# Patient Record
Sex: Female | Born: 1990
Health system: Southern US, Community
[De-identification: ages and names within clinical notes are randomized; demographics above are authoritative.]

---
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
## 2012-01-29 NOTE — L&D Delivery Note (Signed)
Attestation of Attending Supervision of Advanced Practitioner (CNM/NP): Evaluation and management procedures were performed by the Advanced Practitioner under my supervision and collaboration.  I have reviewed the Advanced Practitioner's note and chart, and I agree with the management and plan.  Skylor Hughson 02/26/2012 9:29 AM   

## 2012-01-29 NOTE — L&D Delivery Note (Signed)
Delivery Note At 7:28 AM a viable female was delivered via Vaginal, Spontaneous Delivery (Presentation: Left Occiput Anterior).  APGAR: 8, 9; weight:pending.   Placenta status: Intact, Spontaneous.  Cord: 3 vessels with the following complications: .  Cord pH: not done  Anesthesia: Local  Episiotomy: None Lacerations: 1st degree;Perineal Suture Repair: 3.0 monocryl Est. Blood Loss (mL): 350  Mom to postpartum.  Baby to nursery-stable. Breast and bottlefeeding, condoms for contraception Marge Duncans 02/23/2012, 7:49 AM

## 2012-02-06 ENCOUNTER — Other Ambulatory Visit (HOSPITAL_COMMUNITY): Payer: Self-pay | Admitting: Nurse Practitioner

## 2012-02-06 DIAGNOSIS — Z1389 Encounter for screening for other disorder: Secondary | ICD-10-CM

## 2012-02-07 ENCOUNTER — Ambulatory Visit (HOSPITAL_COMMUNITY)
Admission: RE | Admit: 2012-02-07 | Discharge: 2012-02-07 | Disposition: A | Discharge status: Home / Self Care | Payer: Medicaid Other | Source: Ambulatory Visit | Attending: Nurse Practitioner | Admitting: Nurse Practitioner

## 2012-02-07 DIAGNOSIS — O365990 Maternal care for other known or suspected poor fetal growth, unspecified trimester, not applicable or unspecified: Secondary | ICD-10-CM | POA: Insufficient documentation

## 2012-02-07 DIAGNOSIS — Z1389 Encounter for screening for other disorder: Secondary | ICD-10-CM

## 2012-02-07 DIAGNOSIS — Z3689 Encounter for other specified antenatal screening: Secondary | ICD-10-CM | POA: Insufficient documentation

## 2012-02-13 ENCOUNTER — Other Ambulatory Visit (HOSPITAL_COMMUNITY): Payer: Self-pay | Admitting: Family

## 2012-02-13 DIAGNOSIS — O26849 Uterine size-date discrepancy, unspecified trimester: Secondary | ICD-10-CM

## 2012-02-15 LAB — OB RESULTS CONSOLE GBS: GBS: NEGATIVE

## 2012-02-23 ENCOUNTER — Inpatient Hospital Stay (HOSPITAL_COMMUNITY)
Admission: AD | Admit: 2012-02-23 | Discharge: 2012-02-24 | DRG: 775 | Disposition: A | Discharge status: Home / Self Care | Payer: Medicaid Other | Source: Ambulatory Visit | Attending: Obstetrics and Gynecology | Admitting: Obstetrics and Gynecology

## 2012-02-23 ENCOUNTER — Encounter (HOSPITAL_COMMUNITY): Payer: Self-pay | Admitting: *Deleted

## 2012-02-23 LAB — CBC
HCT: 36.8 % (ref 36.0–46.0)
Hemoglobin: 12.4 g/dL (ref 12.0–15.0)
MCV: 83.6 fL (ref 78.0–100.0)
WBC: 8 10*3/uL (ref 4.0–10.5)

## 2012-02-23 LAB — TYPE AND SCREEN: Antibody Screen: NEGATIVE

## 2012-02-23 MED ORDER — MEASLES, MUMPS & RUBELLA VAC ~~LOC~~ INJ
0.5000 mL | INJECTION | Freq: Once | SUBCUTANEOUS | Status: DC
  Filled 2012-02-23: qty 0.5

## 2012-02-23 MED ORDER — ONDANSETRON HCL 4 MG/2ML IJ SOLN: 4.0000 mg | INTRAMUSCULAR | Status: DC | PRN

## 2012-02-23 MED ORDER — OXYTOCIN 40 UNITS IN LACTATED RINGERS INFUSION - SIMPLE MED: 62.5000 mL/h | INTRAVENOUS | Status: DC | PRN

## 2012-02-23 MED ORDER — BENZOCAINE-MENTHOL 20-0.5 % EX AERO
1.0000 "application " | INHALATION_SPRAY | CUTANEOUS | Status: DC | PRN
  Administered 2012-02-23: 1 via TOPICAL
  Filled 2012-02-23: qty 56

## 2012-02-23 MED ORDER — SODIUM CHLORIDE 0.9 % IV SOLN: 250.0000 mL | INTRAVENOUS | Status: DC | PRN

## 2012-02-23 MED ORDER — WITCH HAZEL-GLYCERIN EX PADS: 1.0000 "application " | MEDICATED_PAD | CUTANEOUS | Status: DC | PRN

## 2012-02-23 MED ORDER — LACTATED RINGERS IV SOLN: INTRAVENOUS | Status: DC

## 2012-02-23 MED ORDER — ZOLPIDEM TARTRATE 5 MG PO TABS: 5.0000 mg | ORAL_TABLET | Freq: Every evening | ORAL | Status: DC | PRN

## 2012-02-23 MED ORDER — OXYTOCIN BOLUS FROM INFUSION: 500.0000 mL | INTRAVENOUS | Status: DC

## 2012-02-23 MED ORDER — ONDANSETRON HCL 4 MG PO TABS: 4.0000 mg | ORAL_TABLET | ORAL | Status: DC | PRN

## 2012-02-23 MED ORDER — IBUPROFEN 600 MG PO TABS
600.0000 mg | ORAL_TABLET | Freq: Four times a day (QID) | ORAL | Status: DC
  Administered 2012-02-23 – 2012-02-24 (×5): 600 mg via ORAL
  Filled 2012-02-23 (×5): qty 1

## 2012-02-23 MED ORDER — BISACODYL 10 MG RE SUPP: 10.0000 mg | Freq: Every day | RECTAL | Status: DC | PRN

## 2012-02-23 MED ORDER — HYDROXYZINE HCL 50 MG/ML IM SOLN: 50.0000 mg | Freq: Four times a day (QID) | INTRAMUSCULAR | Status: DC | PRN

## 2012-02-23 MED ORDER — SIMETHICONE 80 MG PO CHEW: 80.0000 mg | CHEWABLE_TABLET | ORAL | Status: DC | PRN

## 2012-02-23 MED ORDER — FLEET ENEMA 7-19 GM/118ML RE ENEM: 1.0000 | ENEMA | Freq: Every day | RECTAL | Status: DC | PRN

## 2012-02-23 MED ORDER — SODIUM CHLORIDE 0.9 % IJ SOLN: 3.0000 mL | INTRAMUSCULAR | Status: DC | PRN

## 2012-02-23 MED ORDER — CITRIC ACID-SODIUM CITRATE 334-500 MG/5ML PO SOLN: 30.0000 mL | ORAL | Status: DC | PRN

## 2012-02-23 MED ORDER — OXYCODONE-ACETAMINOPHEN 5-325 MG PO TABS: 1.0000 | ORAL_TABLET | ORAL | Status: DC | PRN

## 2012-02-23 MED ORDER — DIBUCAINE 1 % RE OINT: 1.0000 "application " | TOPICAL_OINTMENT | RECTAL | Status: DC | PRN

## 2012-02-23 MED ORDER — ACETAMINOPHEN 325 MG PO TABS: 650.0000 mg | ORAL_TABLET | ORAL | Status: DC | PRN

## 2012-02-23 MED ORDER — DIPHENHYDRAMINE HCL 25 MG PO CAPS: 25.0000 mg | ORAL_CAPSULE | Freq: Four times a day (QID) | ORAL | Status: DC | PRN

## 2012-02-23 MED ORDER — FENTANYL CITRATE 0.05 MG/ML IJ SOLN: 100.0000 ug | INTRAMUSCULAR | Status: DC | PRN

## 2012-02-23 MED ORDER — LIDOCAINE HCL (PF) 1 % IJ SOLN
30.0000 mL | INTRAMUSCULAR | Status: DC | PRN
  Administered 2012-02-23: 30 mL via SUBCUTANEOUS
  Filled 2012-02-23: qty 30

## 2012-02-23 MED ORDER — OXYCODONE-ACETAMINOPHEN 5-325 MG PO TABS
1.0000 | ORAL_TABLET | ORAL | Status: DC | PRN
  Administered 2012-02-23: 1 via ORAL
  Filled 2012-02-23: qty 1

## 2012-02-23 MED ORDER — TETANUS-DIPHTH-ACELL PERTUSSIS 5-2.5-18.5 LF-MCG/0.5 IM SUSP
0.5000 mL | Freq: Once | INTRAMUSCULAR | Status: AC
  Administered 2012-02-24: 0.5 mL via INTRAMUSCULAR
  Filled 2012-02-23: qty 0.5

## 2012-02-23 MED ORDER — OXYTOCIN 40 UNITS IN LACTATED RINGERS INFUSION - SIMPLE MED
62.5000 mL/h | INTRAVENOUS | Status: DC
  Administered 2012-02-23: 62.5 mL/h via INTRAVENOUS
  Filled 2012-02-23: qty 1000

## 2012-02-23 MED ORDER — SENNOSIDES-DOCUSATE SODIUM 8.6-50 MG PO TABS
2.0000 | ORAL_TABLET | Freq: Every day | ORAL | Status: DC
  Administered 2012-02-23: 2 via ORAL

## 2012-02-23 MED ORDER — IBUPROFEN 600 MG PO TABS
600.0000 mg | ORAL_TABLET | Freq: Four times a day (QID) | ORAL | Status: DC | PRN
  Administered 2012-02-23: 600 mg via ORAL
  Filled 2012-02-23: qty 1

## 2012-02-23 MED ORDER — ONDANSETRON HCL 4 MG/2ML IJ SOLN: 4.0000 mg | Freq: Four times a day (QID) | INTRAMUSCULAR | Status: DC | PRN

## 2012-02-23 MED ORDER — HYDROXYZINE HCL 50 MG PO TABS: 50.0000 mg | ORAL_TABLET | Freq: Four times a day (QID) | ORAL | Status: DC | PRN

## 2012-02-23 MED ORDER — SODIUM CHLORIDE 0.9 % IJ SOLN: 3.0000 mL | Freq: Two times a day (BID) | INTRAMUSCULAR | Status: DC

## 2012-02-23 MED ORDER — LACTATED RINGERS IV SOLN: 500.0000 mL | INTRAVENOUS | Status: DC | PRN

## 2012-02-23 MED ORDER — LANOLIN HYDROUS EX OINT: TOPICAL_OINTMENT | CUTANEOUS | Status: DC | PRN

## 2012-02-23 MED ORDER — PRENATAL MULTIVITAMIN CH
1.0000 | ORAL_TABLET | Freq: Every day | ORAL | Status: DC
  Administered 2012-02-23 – 2012-02-24 (×2): 1 via ORAL
  Filled 2012-02-23 (×2): qty 1

## 2012-02-23 MED ORDER — FLEET ENEMA 7-19 GM/118ML RE ENEM: 1.0000 | ENEMA | RECTAL | Status: DC | PRN

## 2012-02-23 NOTE — MAU Note (Signed)
PT ARRIVED   VERY WET AND UNCOMFORTABLE ,  CALLED TO  THRU-PUT .   WITH W/C  TO RM 165

## 2012-02-23 NOTE — H&P (Signed)
Latasha Kramer is a 22 y.o. G11P1001 female at [redacted]w[redacted]d by LMP that correlates well w/ 19wk u/s, presenting w/ report of large gush of fluid at 0430 w/ uc's.  Good fm.  Denies vb.  Onset of regular pnc at [facility] @ 18.1wks.  Genetic screening and anatomy u/s normal. 1hr glucola 93, gbs neg.  H/O SVD @ 40wks, 6lb6oz infant.   Maternal Medical History:  Reason for admission: Reason for admission: rupture of membranes and contractions.  Contractions: Onset was 1-2 hours ago.   Frequency: regular.   Perceived severity is strong.    Fetal activity: Perceived fetal activity is normal.   Last perceived fetal movement was within the past hour.    Prenatal complications: Late care @ 18.1wks  Prenatal Complications - Diabetes: none.    OB History    Grav Para Term Preterm Abortions TAB SAB Ect Mult Living   2 1 1  0 0 0 0 0 0 1     History reviewed. No pertinent past medical history. History reviewed. No pertinent past surgical history. Family History: family history is not on file. Social History:  reports that she has never smoked. She has never used smokeless tobacco. She reports that she does not drink alcohol or use illicit drugs.   Review of Systems  Constitutional: Negative.   HENT: Negative.   Eyes: Negative.   Respiratory: Negative.   Cardiovascular: Negative.   Gastrointestinal: Positive for abdominal pain (uc's).  Genitourinary: Negative.   Musculoskeletal: Negative.   Skin: Negative.   Neurological: Negative.   Endo/Heme/Allergies: Negative.   Psychiatric/Behavioral: Negative.    Blood pressure 115/76, pulse 85, temperature 98 F (36.7 C), temperature source Oral, resp. rate 20, height 5\' 1"  (1.549 m), weight 60.328 kg (133 lb).  Maternal Exam:  Uterine Assessment: Contraction strength is firm.  Contraction frequency is regular.   Abdomen: Fetal presentation: vertex  Introitus: Normal vulva. Normal vagina.  Ferning test: not done.  Amniotic fluid character: clear.  Pelvis:  adequate for delivery.   Cervix: Cervix evaluated by digital exam.     Physical Exam  Constitutional: She is oriented to person, place, and time. She appears well-developed and well-nourished.  HENT:  Head: Normocephalic.  Neck: Normal range of motion.  Cardiovascular: Normal rate and regular rhythm.   Respiratory: Effort normal and breath sounds normal.  GI: Soft. There is no tenderness.       gravid  Genitourinary: Vagina normal and uterus normal.  Musculoskeletal: Normal range of motion.  Neurological: She is alert and oriented to person, place, and time. She has normal reflexes.  Skin: Skin is warm and dry.  Psychiatric: She has a normal mood and affect. Her behavior is normal. Judgment and thought content normal.    FHR: 145, min variability, no accels, earlies= Cat II UCs: 1-8mins SVE by RN: 4/70/-1 to 0, grossly ruptured clear fluid  Prenatal labs: ABO, Rh: --/--/O POS (01/26 1610) Antibody: NEG (01/26 0640) Rubella: Immune (09/11 0000) RPR: Nonreactive (11/22 0000)  HBsAg: Negative (09/11 0000)  HIV: Non-reactive (09/11 0000)  GBS: Negative (01/18 0000)  1hr=93  Assessment/Plan: A:  [redacted]w[redacted]d SIUP  SROM w/ active labor  GBS neg  Cat II FHR  P:  Admit to BS  Expectant management  IV pain meds/epidural prn request  Anticipate NSVD   Marge Duncans 02/23/2012, 0700

## 2012-02-24 LAB — HEMOGLOBIN AND HEMATOCRIT, BLOOD: Hemoglobin: 11.7 g/dL — ABNORMAL LOW (ref 12.0–15.0)

## 2012-02-24 LAB — RPR: RPR Ser Ql: NONREACTIVE

## 2012-02-24 MED ORDER — IBUPROFEN 600 MG PO TABS: 600.0000 mg | ORAL_TABLET | Freq: Four times a day (QID) | ORAL | Status: DC

## 2012-02-24 NOTE — Progress Notes (Signed)
UR chart review completed.  

## 2012-02-24 NOTE — Discharge Summary (Signed)
Obstetric Discharge Summary Reason for Admission: onset of labor and rupture of membranes Prenatal Procedures: ultrasound Intrapartum Procedures: spontaneous vaginal delivery Postpartum Procedures: none Complications-Operative and Postpartum: 1st  degree perineal laceration Hemoglobin  Date Value Range Status  02/23/2012 12.4  12.0 - 15.0 g/dL Final     HCT  Date Value Range Status  02/23/2012 36.8  36.0 - 46.0 % Final    Physical Exam:  General: alert, cooperative and no distress Lochia: appropriate Uterine Fundus: firm Incision: no hematoma at site of repair DVT Evaluation: No evidence of DVT seen on physical exam. No cords or calf tenderness. No significant calf/ankle edema.  Discharge Diagnoses: Term Pregnancy-delivered  Discharge Information: Date: 02/24/2012 Activity: pelvic rest Diet: routine Medications: PNV and Ibuprofen Condition: stable Instructions: refer to practice specific booklet Discharge to: home Follow-up Information    Follow up with [facility]. Schedule an appointment as soon as possible for a visit in 6 weeks.   Contact information:   [address] [place] [address] [phone]         Newborn Data: Live born female  Birth Weight: 5 lb 14.9 oz (2690 g) APGAR: 8, 9  Home with mother.  Bonnita Nasuti 02/24/2012, 7:40 AM I have seen the patient with the resident and agree with the above

## 2012-02-24 NOTE — H&P (Signed)
Attestation of Attending Supervision of Advanced Practitioner (CNM/NP): Evaluation and management procedures were performed by the Advanced Practitioner under my supervision and collaboration.  I have reviewed the Advanced Practitioner's note and chart, and I agree with the management and plan.  Khiry Pasquariello 02/24/2012 12:13 PM

## 2012-02-25 NOTE — Discharge Summary (Signed)
I have reviewed chart and agree with the plan of care.  HARRAWAY-SMITH, Herminio Kniskern, M.D, FACOG    

## 2012-02-27 ENCOUNTER — Ambulatory Visit (HOSPITAL_COMMUNITY): Admission: RE | Admit: 2012-02-27 | Payer: Medicaid Other | Source: Ambulatory Visit

## 2013-02-13 ENCOUNTER — Emergency Department (HOSPITAL_COMMUNITY)
Admission: EM | Admit: 2013-02-13 | Discharge: 2013-02-13 | Disposition: A | Discharge status: Home / Self Care | Payer: Medicaid Other | Attending: Emergency Medicine | Admitting: Emergency Medicine

## 2013-02-13 ENCOUNTER — Encounter (HOSPITAL_COMMUNITY): Payer: Self-pay | Admitting: Emergency Medicine

## 2013-02-13 DIAGNOSIS — N390 Urinary tract infection, site not specified: Secondary | ICD-10-CM

## 2013-02-13 DIAGNOSIS — T8384XA Pain from genitourinary prosthetic devices, implants and grafts, initial encounter: Secondary | ICD-10-CM

## 2013-02-13 DIAGNOSIS — Z3202 Encounter for pregnancy test, result negative: Secondary | ICD-10-CM | POA: Insufficient documentation

## 2013-02-13 DIAGNOSIS — G8918 Other acute postprocedural pain: Secondary | ICD-10-CM | POA: Insufficient documentation

## 2013-02-13 LAB — WET PREP, GENITAL
CLUE CELLS WET PREP: NONE SEEN
TRICH WET PREP: NONE SEEN
YEAST WET PREP: NONE SEEN

## 2013-02-13 LAB — URINE MICROSCOPIC-ADD ON

## 2013-02-13 LAB — URINALYSIS, ROUTINE W REFLEX MICROSCOPIC
Bilirubin Urine: NEGATIVE
GLUCOSE, UA: NEGATIVE mg/dL
KETONES UR: NEGATIVE mg/dL
NITRITE: NEGATIVE
PH: 7 (ref 5.0–8.0)
Protein, ur: NEGATIVE mg/dL
SPECIFIC GRAVITY, URINE: 1.015 (ref 1.005–1.030)
Urobilinogen, UA: 0.2 mg/dL (ref 0.0–1.0)

## 2013-02-13 LAB — PREGNANCY, URINE: PREG TEST UR: NEGATIVE

## 2013-02-13 MED ORDER — OXYCODONE-ACETAMINOPHEN 5-325 MG PO TABS
1.0000 | ORAL_TABLET | Freq: Once | ORAL | Status: AC
  Administered 2013-02-13: 1 via ORAL
  Filled 2013-02-13: qty 1

## 2013-02-13 MED ORDER — HYDROCODONE-ACETAMINOPHEN 5-325 MG PO TABS: 1.0000 | ORAL_TABLET | ORAL | Status: DC | PRN

## 2013-02-13 MED ORDER — CIPROFLOXACIN HCL 500 MG PO TABS: 500.0000 mg | ORAL_TABLET | Freq: Two times a day (BID) | ORAL | Status: DC

## 2013-02-13 MED ORDER — HYDROCODONE-ACETAMINOPHEN 5-325 MG PO TABS
1.0000 | ORAL_TABLET | Freq: Once | ORAL | Status: AC
  Administered 2013-02-13: 1 via ORAL
  Filled 2013-02-13: qty 1

## 2013-02-13 NOTE — Discharge Instructions (Signed)
Call for a follow up appointment with a Family or Primary Care Provider, call Women's clinic for evaluation of your IUD. Return if Symptoms worsen.   Take medication as prescribed.

## 2013-02-13 NOTE — ED Notes (Addendum)
Pt c/o pelvic pain and low back pain onset Tuesday. Pt denies injury. Pt denies pain with urination or discharge. Pt has IUD

## 2013-02-13 NOTE — ED Provider Notes (Signed)
CSN: [id number]     Arrival date & time 02/13/13  1610 History   First MD Initiated Contact with Patient 02/13/13 1727     Chief Complaint  Patient presents with  . Pelvic Pain   (Consider location/radiation/quality/duration/timing/severity/associated sxs/prior Treatment) HPI Comments: Latasha Kramer is a [age]year-old female, presenting the Emergency Department with a chief complaint of pelvic pain for 4 days.  The patient reports she had an IUD placed by the health department for 4 days ago and has had pelvic cramping since.  She reports associated lower back pain.  She reports hematuria today.  She reports las BM today, normal non-bloody. LMP 05/2011   The history is provided by the patient and medical records. No language interpreter was used.    History reviewed. No pertinent past medical history. History reviewed. No pertinent past surgical history. No family history on file. History  Substance Use Topics  . Smoking status: Never Smoker   . Smokeless tobacco: Never Used  . Alcohol Use: No   OB History   Grav Para Term Preterm Abortions TAB SAB Ect Mult Living   2 2 2  0 0 0 0 0 0 2     Review of Systems  Constitutional: Negative for fever and chills.  Gastrointestinal: Positive for abdominal pain. Negative for nausea, vomiting, diarrhea, constipation and blood in stool.  Genitourinary: Positive for hematuria and pelvic pain. Negative for dysuria, urgency, flank pain, vaginal bleeding and vaginal discharge.    Allergies  Review of patient's allergies indicates no known allergies.  Home Medications   Current Outpatient Rx  Name  Route  Sig  Dispense  Refill  . ibuprofen (ADVIL,MOTRIN) 600 MG tablet   Oral   Take 1 tablet (600 mg total) by mouth every 6 (six) hours.   50 tablet   3   . ciprofloxacin (CIPRO) 500 MG tablet   Oral   Take 1 tablet (500 mg total) by mouth 2 (two) times daily.   14 tablet   0   . HYDROcodone-acetaminophen (NORCO/VICODIN) 5-325 MG per tablet  Oral   Take 1 tablet by mouth every 4 (four) hours as needed.   15 tablet   0    BP 119/77  Pulse 83  Temp(Src) 97.9 F (36.6 C) (Oral)  Resp 20  Ht 5\' 1"  (1.549 m)  Wt 108 lb (48.988 kg)  BMI 20.42 kg/m2  SpO2 100%  LMP 06/13/2012  Breastfeeding? No Physical Exam  Nursing note and vitals reviewed. Constitutional: Vital signs are normal. She appears well-developed and well-nourished.  Appears uncomfortable  HENT:  Head: Normocephalic and atraumatic.  Eyes: No scleral icterus.  Cardiovascular: Normal rate and regular rhythm.   No murmur heard. Pulmonary/Chest: Effort normal and breath sounds normal.  Abdominal: Soft. Normal appearance and bowel sounds are normal. There is tenderness in the suprapubic area. There is no rebound, no guarding, no CVA tenderness, no tenderness at McBurney's point and negative Murphy's sign.    Genitourinary: Uterus is tender. Cervix exhibits discharge. Right adnexum displays no mass and no tenderness. Left adnexum displays no mass and no tenderness.  Tenderness with bimanual to palpation, scant bleeding with clear discharge per cervical os.  Two strings identified originating from the cervical os. Scant blood with discharge identified in the vaginal vault.  Musculoskeletal: Normal range of motion.  Neurological: She is alert.  Skin: Skin is warm and dry.  Psychiatric: She has a normal mood and affect. Her behavior is normal.    ED Course  Procedures (including critical care time) Labs Review Labs Reviewed  WET PREP, GENITAL - Abnormal; Notable for the following:    WBC, Wet Prep HPF POC FEW (*)    All other components within normal limits  URINALYSIS, ROUTINE W REFLEX MICROSCOPIC - Abnormal; Notable for the following:    APPearance CLOUDY (*)    Hgb urine dipstick MODERATE (*)    Leukocytes, UA MODERATE (*)    All other components within normal limits  URINE MICROSCOPIC-ADD ON - Abnormal; Notable for the following:    Bacteria, UA FEW (*)     All other components within normal limits  GC/CHLAMYDIA PROBE AMP  URINE CULTURE  PREGNANCY, URINE   Imaging Review No results found.  EKG Interpretation   None       MDM   1. Pain due to intrauterine contraceptive device (IUD)   2. UTI (urinary tract infection)    Pt with recent IUD placement. Pelvic exam reveals 2 strings and scant blood.  UA with positive leukocytes and Hbg, few bacteria.  Will treat for UTI and pain management.  Will have the patient follow up with the health department, where it was placed.  Referral to women's clinic. Discussed lab results, and treatment plan with the patient. Return precautions given. Reports understanding and no other concerns at this time.  Patient is stable for discharge at this time.   Meds given in ED:  Medications  oxyCODONE-acetaminophen (PERCOCET/ROXICET) 5-325 MG per tablet 1 tablet (1 tablet Oral Given 02/13/13 1647)  HYDROcodone-acetaminophen (NORCO/VICODIN) 5-325 MG per tablet 1 tablet (1 tablet Oral Given 02/13/13 1927)    Discharge Medication List as of 02/13/2013  8:33 PM    START taking these medications   Details  ciprofloxacin (CIPRO) 500 MG tablet Take 1 tablet (500 mg total) by mouth 2 (two) times daily., Starting 02/13/2013, Until Discontinued, Print    HYDROcodone-acetaminophen (NORCO/VICODIN) 5-325 MG per tablet Take 1 tablet by mouth every 4 (four) hours as needed., Starting 02/13/2013, Until Discontinued, Print            Clabe Seal, PA-C 02/16/13 2316

## 2013-02-15 LAB — URINE CULTURE

## 2013-02-15 LAB — GC/CHLAMYDIA PROBE AMP
CT Probe RNA: NEGATIVE
GC PROBE AMP APTIMA: NEGATIVE

## 2013-02-22 NOTE — ED Provider Notes (Signed)
Medical screening examination/treatment/procedure(s) were performed by non-physician practitioner and as supervising physician I was immediately available for consultation/collaboration.  EKG Interpretation   None         Rolan BuccoMelanie Romey Mathieson, MD 02/22/13 0710

## 2013-03-16 ENCOUNTER — Encounter (HOSPITAL_COMMUNITY): Payer: Self-pay | Admitting: Emergency Medicine

## 2013-03-16 ENCOUNTER — Emergency Department (HOSPITAL_COMMUNITY): Payer: Medicaid Other

## 2013-03-16 ENCOUNTER — Emergency Department (HOSPITAL_COMMUNITY)
Admission: EM | Admit: 2013-03-16 | Discharge: 2013-03-16 | Disposition: A | Discharge status: Home / Self Care | Payer: Medicaid Other | Attending: Emergency Medicine | Admitting: Emergency Medicine

## 2013-03-16 DIAGNOSIS — R109 Unspecified abdominal pain: Secondary | ICD-10-CM | POA: Insufficient documentation

## 2013-03-16 DIAGNOSIS — Z3202 Encounter for pregnancy test, result negative: Secondary | ICD-10-CM | POA: Insufficient documentation

## 2013-03-16 DIAGNOSIS — Z792 Long term (current) use of antibiotics: Secondary | ICD-10-CM | POA: Insufficient documentation

## 2013-03-16 DIAGNOSIS — K5900 Constipation, unspecified: Secondary | ICD-10-CM | POA: Insufficient documentation

## 2013-03-16 LAB — CBC WITH DIFFERENTIAL/PLATELET
BASOS PCT: 0 % (ref 0–1)
Basophils Absolute: 0 10*3/uL (ref 0.0–0.1)
EOS ABS: 0 10*3/uL (ref 0.0–0.7)
Eosinophils Relative: 0 % (ref 0–5)
HEMATOCRIT: 40.1 % (ref 36.0–46.0)
HEMOGLOBIN: 14.2 g/dL (ref 12.0–15.0)
LYMPHS ABS: 1.2 10*3/uL (ref 0.7–4.0)
Lymphocytes Relative: 11 % — ABNORMAL LOW (ref 12–46)
MCH: 30.3 pg (ref 26.0–34.0)
MCHC: 35.4 g/dL (ref 30.0–36.0)
MCV: 85.5 fL (ref 78.0–100.0)
MONO ABS: 0.4 10*3/uL (ref 0.1–1.0)
MONOS PCT: 4 % (ref 3–12)
NEUTROS PCT: 85 % — AB (ref 43–77)
Neutro Abs: 9.4 10*3/uL — ABNORMAL HIGH (ref 1.7–7.7)
Platelets: 176 10*3/uL (ref 150–400)
RBC: 4.69 MIL/uL (ref 3.87–5.11)
RDW: 12.8 % (ref 11.5–15.5)
WBC: 11 10*3/uL — ABNORMAL HIGH (ref 4.0–10.5)

## 2013-03-16 LAB — COMPREHENSIVE METABOLIC PANEL
ALK PHOS: 44 U/L (ref 39–117)
ALT: 15 U/L (ref 0–35)
AST: 17 U/L (ref 0–37)
Albumin: 4.3 g/dL (ref 3.5–5.2)
BUN: 12 mg/dL (ref 6–23)
CALCIUM: 9.7 mg/dL (ref 8.4–10.5)
CO2: 22 meq/L (ref 19–32)
Chloride: 103 mEq/L (ref 96–112)
Creatinine, Ser: 0.62 mg/dL (ref 0.50–1.10)
GLUCOSE: 92 mg/dL (ref 70–99)
Potassium: 3.9 mEq/L (ref 3.7–5.3)
SODIUM: 140 meq/L (ref 137–147)
Total Bilirubin: 0.4 mg/dL (ref 0.3–1.2)
Total Protein: 8 g/dL (ref 6.0–8.3)

## 2013-03-16 LAB — URINE MICROSCOPIC-ADD ON

## 2013-03-16 LAB — URINALYSIS, ROUTINE W REFLEX MICROSCOPIC
BILIRUBIN URINE: NEGATIVE
Glucose, UA: NEGATIVE mg/dL
KETONES UR: NEGATIVE mg/dL
NITRITE: NEGATIVE
Protein, ur: NEGATIVE mg/dL
SPECIFIC GRAVITY, URINE: 1.006 (ref 1.005–1.030)
UROBILINOGEN UA: 0.2 mg/dL (ref 0.0–1.0)
pH: 7 (ref 5.0–8.0)

## 2013-03-16 LAB — LIPASE, BLOOD: Lipase: 35 U/L (ref 11–59)

## 2013-03-16 LAB — POCT PREGNANCY, URINE: Preg Test, Ur: NEGATIVE

## 2013-03-16 MED ORDER — SODIUM CHLORIDE 0.9 % IV BOLUS (SEPSIS)
1000.0000 mL | Freq: Once | INTRAVENOUS | Status: AC
  Administered 2013-03-16: 1000 mL via INTRAVENOUS

## 2013-03-16 MED ORDER — DOCUSATE SODIUM 100 MG PO CAPS: 100.0000 mg | ORAL_CAPSULE | Freq: Two times a day (BID) | ORAL | Status: DC

## 2013-03-16 MED ORDER — POLYETHYLENE GLYCOL 3350 17 GM/SCOOP PO POWD: 17.0000 g | Freq: Two times a day (BID) | ORAL | Status: DC

## 2013-03-16 MED ORDER — MORPHINE SULFATE 4 MG/ML IJ SOLN
4.0000 mg | Freq: Once | INTRAMUSCULAR | Status: AC
  Administered 2013-03-16: 4 mg via INTRAVENOUS
  Filled 2013-03-16: qty 1

## 2013-03-16 MED ORDER — TRAMADOL HCL 50 MG PO TABS: 50.0000 mg | ORAL_TABLET | Freq: Four times a day (QID) | ORAL | Status: DC | PRN

## 2013-03-16 NOTE — ED Notes (Signed)
Pt transported to xray 

## 2013-03-16 NOTE — ED Notes (Signed)
Pt is here with abdominal pain that has increased over the last three days and patient reports that she cannot have BM.  LMP 2013 and patient gets depo shot.  No urinary symptoms.  No vaginal bleeding or discharge

## 2013-03-16 NOTE — ED Provider Notes (Signed)
CSN: [id number]     Arrival date & time 03/16/13  1014 History   First MD Initiated Contact with Patient 03/16/13 1015     Chief Complaint  Patient presents with  . Abdominal Pain  . Constipation     (Consider location/radiation/quality/duration/timing/severity/associated sxs/prior Treatment) HPI Comments: Patient presents to the ED with a chief compliant of constipation.  She states that she has not been able to have a BM since yesterday morning, and that she has had increasing abdominal pain for the past 3 days.  She denies fevers, chills, nausea, vomiting, or diarrhea.  She denies dysuria, or vaginal discharge.  She has not tried taking anything to alleviate her symptoms.  Nothing makes her symptoms better or worse.  No history of abdominal surgery.  She states that her pain is moderate.  It is not localized.  It does not radiate.  The history is provided by the patient. No language interpreter was used.    History reviewed. No pertinent past medical history. History reviewed. No pertinent past surgical history. No family history on file. History  Substance Use Topics  . Smoking status: Never Smoker   . Smokeless tobacco: Never Used  . Alcohol Use: No   OB History   Grav Para Term Preterm Abortions TAB SAB Ect Mult Living   2 2 2  0 0 0 0 0 0 2     Review of Systems  All other systems reviewed and are negative.      Allergies  Review of patient's allergies indicates no known allergies.  Home Medications   Current Outpatient Rx  Name  Route  Sig  Dispense  Refill  . ciprofloxacin (CIPRO) 500 MG tablet   Oral   Take 1 tablet (500 mg total) by mouth 2 (two) times daily.   14 tablet   0   . HYDROcodone-acetaminophen (NORCO/VICODIN) 5-325 MG per tablet   Oral   Take 1 tablet by mouth every 4 (four) hours as needed.   15 tablet   0   . ibuprofen (ADVIL,MOTRIN) 600 MG tablet   Oral   Take 1 tablet (600 mg total) by mouth every 6 (six) hours.   50 tablet   3     BP 118/88  Pulse 73  Temp(Src) 97.5 F (36.4 C) (Oral)  Resp 18  SpO2 100% Physical Exam  Nursing note and vitals reviewed. Constitutional: She is oriented to person, place, and time. She appears well-developed and well-nourished.  HENT:  Head: Normocephalic and atraumatic.  Eyes: Conjunctivae and EOM are normal. Pupils are equal, round, and reactive to light.  Neck: Normal range of motion. Neck supple.  Cardiovascular: Normal rate and regular rhythm.  Exam reveals no gallop and no friction rub.   No murmur heard. Pulmonary/Chest: Effort normal and breath sounds normal. No respiratory distress. She has no wheezes. She has no rales. She exhibits no tenderness.  Abdominal: Soft. Bowel sounds are normal. She exhibits no distension and no mass. There is no tenderness. There is no rebound and no guarding.  No focal abdominal tenderness, no RLQ tenderness or pain at McBurney's point, no RUQ tenderness or Murphy's sign, no left-sided abdominal tenderness, no fluid wave, or signs of peritonitis   Genitourinary:  Rectal exam chaperoned, no stool ball, no hemorrhoids, masses, abscess, or other abnormality  Musculoskeletal: Normal range of motion. She exhibits no edema and no tenderness.  Neurological: She is alert and oriented to person, place, and time.  Skin: Skin is warm and dry.  Psychiatric: She has a normal mood and affect. Her behavior is normal. Judgment and thought content normal.    ED Course  Procedures (including critical care time) Results for orders placed during the hospital encounter of 03/16/13  CBC WITH DIFFERENTIAL      Result Value Ref Range   WBC 11.0 (*) 4.0 - 10.5 K/uL   RBC 4.69  3.87 - 5.11 MIL/uL   Hemoglobin 14.2  12.0 - 15.0 g/dL   HCT 16.1  09.6 - 04.5 %   MCV 85.5  78.0 - 100.0 fL   MCH 30.3  26.0 - 34.0 pg   MCHC 35.4  30.0 - 36.0 g/dL   RDW 40.9  81.1 - 91.4 %   Platelets 176  150 - 400 K/uL   Neutrophils Relative % 85 (*) 43 - 77 %   Neutro Abs 9.4  (*) 1.7 - 7.7 K/uL   Lymphocytes Relative 11 (*) 12 - 46 %   Lymphs Abs 1.2  0.7 - 4.0 K/uL   Monocytes Relative 4  3 - 12 %   Monocytes Absolute 0.4  0.1 - 1.0 K/uL   Eosinophils Relative 0  0 - 5 %   Eosinophils Absolute 0.0  0.0 - 0.7 K/uL   Basophils Relative 0  0 - 1 %   Basophils Absolute 0.0  0.0 - 0.1 K/uL  LIPASE, BLOOD      Result Value Ref Range   Lipase 35  11 - 59 U/L  COMPREHENSIVE METABOLIC PANEL      Result Value Ref Range   Sodium 140  137 - 147 mEq/L   Potassium 3.9  3.7 - 5.3 mEq/L   Chloride 103  96 - 112 mEq/L   CO2 22  19 - 32 mEq/L   Glucose, Bld 92  70 - 99 mg/dL   BUN 12  6 - 23 mg/dL   Creatinine, Ser 7.82  0.50 - 1.10 mg/dL   Calcium 9.7  8.4 - 95.6 mg/dL   Total Protein 8.0  6.0 - 8.3 g/dL   Albumin 4.3  3.5 - 5.2 g/dL   AST 17  0 - 37 U/L   ALT 15  0 - 35 U/L   Alkaline Phosphatase 44  39 - 117 U/L   Total Bilirubin 0.4  0.3 - 1.2 mg/dL   GFR calc non Af Amer >90  >90 mL/min   GFR calc Af Amer >90  >90 mL/min  POCT PREGNANCY, URINE      Result Value Ref Range   Preg Test, Ur NEGATIVE  NEGATIVE   Dg Abd Acute W/chest  03/16/2013   CLINICAL DATA:  Abdominal pain and nausea  EXAM: ACUTE ABDOMEN SERIES (ABDOMEN 2 VIEW & CHEST 1 VIEW)  COMPARISON:  None.  FINDINGS: PA chest: Lungs are clear. Heart size and pulmonary vascularity are normal. No adenopathy.  Supine and upright abdomen: There is a paucity of small bowel gas. No free air. There is lumbar levoscoliosis. There is a questionable 2 mm calculus in the left kidney.  IMPRESSION: Paucity of small bowel gas. This is a finding that may be seen normally. However, it may also be seen with enteritis or early ileus. Obstruction is not felt to be likely. No free air. Question small calculus in left kidney.   Electronically Signed   By: Bretta Bang M.D.   On: 03/16/2013 11:01      EKG Interpretation   None       MDM   Final  diagnoses:  Abdominal pain    Patient with constipation x 3 days.   No stool ball felt.  No evidence of SBO on plain films.  I do not feel that additional imaging is warranted at this time as the patient looks well and feels better in the ED.  Vitals are normal.  Mild leukocytosis, but no focal abdominal tenderness.  No signs of surgical or acute abdomen.  No dysuria or fever.  Labs are otherwise reassuring.  I will discharge to home with PCP follow up.  Will give the patient a stool softener.  Patient given specific return precautions.  I discussed the patient with Dr. Rubin PayorPickering, who agrees with the plan.  Filed Vitals:   03/16/13 1229  BP: 114/70  Pulse: 76  Temp: 98.6 F (37 C)  Resp: [address]18       Olayinka Gathers, [place] JerseyPA-C 03/16/13 1334

## 2013-03-16 NOTE — Discharge Instructions (Signed)
Abdominal Pain, Adult °Many things can cause abdominal pain. Usually, abdominal pain is not caused by a disease and will improve without treatment. It can often be observed and treated at home. Your health care provider will do a physical exam and possibly order blood tests and X-rays to help determine the seriousness of your pain. However, in many cases, more time must pass before a clear cause of the pain can be found. Before that point, your health care provider may not know if you need more testing or further treatment. °HOME CARE INSTRUCTIONS  °Monitor your abdominal pain for any changes. The following actions may help to alleviate any discomfort you are experiencing: °· Only take over-the-counter or prescription medicines as directed by your health care provider. °· Do not take laxatives unless directed to do so by your health care provider. °· Try a clear liquid diet (broth, tea, or water) as directed by your health care provider. Slowly move to a bland diet as tolerated. °SEEK MEDICAL CARE IF: °· You have unexplained abdominal pain. °· You have abdominal pain associated with nausea or diarrhea. °· You have pain when you urinate or have a bowel movement. °· You experience abdominal pain that wakes you in the night. °· You have abdominal pain that is worsened or improved by eating food. °· You have abdominal pain that is worsened with eating fatty foods. °SEEK IMMEDIATE MEDICAL CARE IF:  °· Your pain does not go away within 2 hours. °· You have a fever. °· You keep throwing up (vomiting). °· Your pain is felt only in portions of the abdomen, such as the right side or the left lower portion of the abdomen. °· You pass bloody or black tarry stools. °MAKE SURE YOU: °· Understand these instructions.   °· Will watch your condition.   °· Will get help right away if you are not doing well or get worse.   °Document Released: 10/24/2004 Document Revised: 11/04/2012 Document Reviewed: 09/23/2012 °ExitCare® Patient  Information ©2014 [employer]. °Constipation, Adult °Constipation is when a person has fewer than 3 bowel movements a week; has difficulty having a bowel movement; or has stools that are dry, hard, or larger than normal. As people grow older, constipation is more common. If you try to fix constipation with medicines that make you have a bowel movement (laxatives), the problem may get worse. Long-term laxative use may cause the muscles of the colon to become weak. A low-fiber diet, not taking in enough fluids, and taking certain medicines may make constipation worse. °CAUSES  °· Certain medicines, such as antidepressants, pain medicine, iron supplements, antacids, and water pills.   °· Certain diseases, such as diabetes, irritable bowel syndrome (IBS), thyroid disease, or depression.   °· Not drinking enough water.   °· Not eating enough fiber-rich foods.   °· Stress or travel. °· Lack of physical activity or exercise. °· Not going to the restroom when there is the urge to have a bowel movement. °· Ignoring the urge to have a bowel movement. °· Using laxatives too much. °SYMPTOMS  °· Having fewer than 3 bowel movements a week.   °· Straining to have a bowel movement.   °· Having hard, dry, or larger than normal stools.   °· Feeling full or bloated.   °· Pain in the lower abdomen. °· Not feeling relief after having a bowel movement. °DIAGNOSIS  °Your caregiver will take a medical history and perform a physical exam. Further testing may be done for severe constipation. Some tests may include:  °· A barium enema   X-ray to examine your rectum, colon, and sometimes, your small intestine. °· A sigmoidoscopy to examine your lower colon. °· A colonoscopy to examine your entire colon. °TREATMENT  °Treatment will depend on the severity of your constipation and what is causing it. Some dietary treatments include drinking more fluids and eating more fiber-rich foods. Lifestyle treatments may include regular exercise. If these  diet and lifestyle recommendations do not help, your caregiver may recommend taking over-the-counter laxative medicines to help you have bowel movements. Prescription medicines may be prescribed if over-the-counter medicines do not work.  °HOME CARE INSTRUCTIONS  °· Increase dietary fiber in your diet, such as fruits, vegetables, whole grains, and beans. Limit high-fat and processed sugars in your diet, such as French fries, hamburgers, cookies, candies, and soda.   °· A fiber supplement may be added to your diet if you cannot get enough fiber from foods.   °· Drink enough fluids to keep your urine clear or pale yellow.   °· Exercise regularly or as directed by your caregiver.   °· Go to the restroom when you have the urge to go. Do not hold it. °· Only take medicines as directed by your caregiver. Do not take other medicines for constipation without talking to your caregiver first. °SEEK IMMEDIATE MEDICAL CARE IF:  °· You have bright red blood in your stool.   °· Your constipation lasts for more than 4 days or gets worse.   °· You have abdominal or rectal pain.   °· You have thin, pencil-like stools. °· You have unexplained weight loss. °MAKE SURE YOU:  °· Understand these instructions. °· Will watch your condition. °· Will get help right away if you are not doing well or get worse. °Document Released: 10/13/2003 Document Revised: 04/08/2011 Document Reviewed: 10/26/2012 °ExitCare® Patient Information ©2014 [employer]. ° °

## 2013-03-16 NOTE — ED Provider Notes (Signed)
Medical screening examination/treatment/procedure(s) were performed by non-physician practitioner and as supervising physician I was immediately available for consultation/collaboration.  EKG Interpretation   None        Juliet RudeNathan R. Rubin PayorPickering, MD 03/16/13 [phone]

## 2013-03-16 NOTE — ED Notes (Signed)
Rectal exam completed by Roxy Horsemanobert Browning, PA. Chaperoned.

## 2013-11-29 ENCOUNTER — Encounter (HOSPITAL_COMMUNITY): Payer: Self-pay | Admitting: Emergency Medicine

## 2017-04-09 ENCOUNTER — Encounter: Payer: Self-pay | Admitting: Family Medicine

## 2017-04-09 ENCOUNTER — Other Ambulatory Visit: Payer: Self-pay

## 2017-04-09 ENCOUNTER — Ambulatory Visit: Payer: BLUE CROSS/BLUE SHIELD | Admitting: Family Medicine

## 2017-04-09 VITALS — BP 102/64 | HR 98 | Temp 98.7°F | Ht 62.0 in | Wt 104.8 lb

## 2017-04-09 DIAGNOSIS — Z01419 Encounter for gynecological examination (general) (routine) without abnormal findings: Secondary | ICD-10-CM

## 2017-04-09 DIAGNOSIS — Z113 Encounter for screening for infections with a predominantly sexual mode of transmission: Secondary | ICD-10-CM

## 2017-04-09 DIAGNOSIS — N898 Other specified noninflammatory disorders of vagina: Secondary | ICD-10-CM | POA: Diagnosis not present

## 2017-04-09 LAB — POCT WET + KOH PREP
Trich by wet prep: ABSENT
Yeast by KOH: ABSENT
Yeast by wet prep: ABSENT

## 2017-04-09 NOTE — Patient Instructions (Addendum)
   IF you received an x-ray today, you will receive an invoice from [facility]. Please contact [facility] at [phone] with questions or concerns regarding your invoice.   IF you received labwork today, you will receive an invoice from [facility]. Please contact [facility] at [phone] with questions or concerns regarding your invoice.   Our billing staff will not be able to assist you with questions regarding bills from these companies.  You will be contacted with the lab results as soon as they are available. The fastest way to get your results is to activate your My Chart account. Instructions are located on the last page of this paperwork. If you have not heard from us regarding the results in 2 weeks, please contact this office.       Ch?m sc phng ng?a 18-39 tu?i, N? gi?i Preventive Care 18-39 Years, Female Ch?m sc phng ng?a ?? c?p ??n cc l?a ch?n l?i s?ng v th?m khm v?i chuyn gia ch?m sc s?c kh?e c th? t?ng c??ng s?c kh?e v h?nh phc. Ch?m sc phng ng?a bao g?m nh?ng g?  Khm s?c kh?e hng n?m. L?n khm ny c?ng ???c g?i l ki?m tra s?c kh?e hng n?m.  Khm nha khoa m?i n?m m?t ho?c hai l?n.  Khm m?t ??nh k?. Hy h?i chuyn gia ch?m sc s?c kh?e v? vi?c bao lu th qu v? nn khm m?t m?t l?n.  Cc l?a ch?n l?i s?ng c nhn, bao g?m: ? Ch?m sc r?ng v l?i hng ngy. ? Hoa?t ??ng th? ch?t th???ng xuyn. ? ?n ch? ?? ?n lnh m?nh. ? Trnh s? d?ng thu?c l v ma ty. ? H?n ch? s? d?ng r??u. ? Quan h? tnh d?c an ton. ? Dng th?c ph?m b? sung vitamin v ch?t khong theo ch? d?n c?a chuyn gia ch?m sc s?c kh?e. Nh?ng g x?y ra trong khi ki?m tra s?c kh?e hng n?m? Cc d?ch v? v xt nghi?m sng l?c do chuyn gia ch?m sc s?c kh?e th?c hi?n trong khi khm s?c kh?e hng n?m s? ph? thu?c vo tu?i, s?c kh?e t?ng th?, cc y?u t? nguy c? t? l?i s?ng v b?nh s? gia ?nh c?a qu v?. T? v?n Chuyn gia ch?m sc s?c kh?e c th? h?i qu v? v?  vi?c:  S? d?ng r??u.  S? d?ng thu?c l.  S? d?ng ma ty.  Tnh tr?ng s?c kh?e tinh th?n.  Tnh tr?ng h?nh phc ? nh v trong m?i quan h?.  Quan h? tnh d?c.  Thi quen ?n u?ng.  Cng vi?c v mi tr??ng lm vi?c.  Ph??ng php ng?a thai.  Chu k? kinh nguy?t.  L?ch s? mang thai.  Sng l?c Qu v? c th? ???c lm cc xt nghi?m ho?c php ?o sau:  Chi?u cao, cn n?ng v BMI.  Sng l?c ti?u ???ng. Xt nghi?m ny ???c th?c hi?n b?ng cch ki?m tra ???ng huy?t (glucose) sau khi qu v? khng ?n g trong m?t kho?ng th?i gian (nh?n ?i).  Huy?t p.  N?ng ?? lipid v cholesterol. L??ng cc ch?t ny c th? ???c ki?m tra m?i 5 n?m m?t l?n b?t ??u t? tu?i 20.  Khm da.  Xt nghi?m mu tm vim gan C.  Xt nghi?m mu tm vim gan B.  Xt nghi?m b?nh ly truy?n qua ???ng tnh d?c (STD).  Sng l?c ung th? lin quan ??n BRCA. Xt nghi?m ny c th? ???c th?c hi?n n?u qu v? c ti?n s? gia ?nh b? ung   th? v, bu?ng tr?ng, ?ng d?n tr?ng ho?c ung th? phc m?c.  Khm khung ch?u v xt nghi?m ph?t t? bo c? t? cung (Pap). Nh?ng xt nghi?m ny c th? ???c th?c hi?n m?i 3 n?m m?t l?n b?t ??u t? tu?i 21. B?t ??u ? tu?i 30, xt nghi?m ny c th? ???c th?c hi?n m?i 5 n?m m?t l?n n?u qu v? ???c lm xt nghi?m Pap k?t h?p v?i xt nghi?m HPV.  Hy th?o lu?n v?i chuyn gia ch?m sc s?c kh?e cu?a qu v? v? cc k?t qu? xt nghi?m, cc ph??ng n ?i?u tr? v n?u c?n, nhu c?u lm thm cc xt nghi?m. V?c xin Chuyn gia ch?m sc s?c kh?e c th? khuy?n ngh? m?t s? v?c xin nh?t ??nh, ch?ng h?n nh?:  V?c xin cm. V?c xin ny ???c khuy?n ngh? hng n?m.  V?c xin u?n vn, b?ch h?u v ho g v bo (Tdap, Td). Qu v? c th? c?n m?t li?u Td nh?c l?i m?i 10 n?m m?t l?n.  V?c xin th?y ??u. Qu v? c th? c?n v?c xin ny n?u ch?a ???c chch ng?a.  V?c xin HPV. N?u qu v? 26 tu?i tr? xu?ng, qu v? c th? c?n ba li?u trong 6 thng.  V?c xin s?i, quai b? v rubella (MMR). Qu v? c th? c?n t nh?t m?t li?u MMR.  Qu v? c?ng c?n li?u th? hai.  V?c xin lin h?p ph? c?u khu?n 13 gi (PCV13). Qu v? c th? c?n v?c xin ny n?u c m?t s? tnh tr?ng b?nh l nh?t ??nh v tr??c ?y ch?a ???c chch ng?a.  V?c xin ph? c?u khu?n polysaccharide (PPSV23). Qu v? c th? c?n m?t ho?c hai li?u n?u qu v? ht thu?c ho?c n?u qu v? c m?t s? tnh tr?ng b?nh l nh?t ??nh.  V?c xin vim mng no. M?t li?u ???c khuy?n ngh? n?u qu v? 19-21 tu?i v l sinh vin n?m ??u ??i h?c s?ng ? k tc x ho?c n?u qu v? c m?t ho?c m?t s? tnh tr?ng b?nh l. Qu v? c?ng c th? c?n thm cc li?u nh?c l?i.  V?c xin vim gan A. Qu v? c th? c?n v?c xin ny n?u c m?t s? tnh tr?ng nh?t ??nh ho?c n?u qu v? ?i du l?ch ho?c lm vi?c ? nh?ng n?i m qu v? c th? b? ph?i nhi?m vim gan A.  V?c xin vim gan B. Qu v? c th? c?n v?c xin ny n?u c m?t s? tnh tr?ng b?nh l nh?t ??nh ho?c n?u qu v? ?i du l?ch ho?c lm vi?c ? nh?ng n?i m qu v? c th? b? ph?i nhi?m vim gan B.  V?c xin Haemophilus influenzae tup b (Hib). Qu v? c th? c?n v?c xin ny n?u c cc y?u t? nguy c? nh?t ??nh.  Hy trao ??i v?i chuyn gia ch?m sc s?c kh?e v? nh?ng xt nghi?m sng l?c v v?c xin no m qu v? c?n v bao lu th c?n cc v?c xin ? m?t l?n. Thng tin ny khng nh?m m?c ?ch thay th? cho l?i khuyn m chuyn gia ch?m sc s?c kh?e ni v?i qu v?. Hy b?o ??m qu v? ph?i th?o lu?n b?t k? v?n ?? g m qu v? c v?i chuyn gia ch?m sc s?c kh?e c?a qu v?. Document Released: 05/02/2016 Document Revised: 05/02/2016 Elsevier Interactive Patient Education  2018 Elsevier Inc.  

## 2017-04-09 NOTE — Progress Notes (Signed)
3/13/20199:41 AM  Elijio Miles 1990/12/29, 27 y.o. female [id number]  Chief Complaint  Patient presents with  . Annual Exam    wanting pap, lab work and STI chks    HPI:   Patient is a 27 y.o. female  who presents today for CPE  G2P2 LMP 03/27/17, regular, normal flow, menarche at age 63 Uses condoms, happy with method, in the past tried depo and mirena Married, safe in relationship Last pap about 4 years ago, normal, denies h/o abnormal Denies h/o STD + vaginal discharge, yellowish, no odor Works as Advertising account planner Originally from [place], Hep B during pregnancy negative Denies fhx breast, ovarian or colon cancer   Depression screen [facility] 2/9 04/09/2017  Decreased Interest 0  Down, Depressed, Hopeless 0  PHQ - 2 Score 0    No Known Allergies  Prior to Admission medications   Not on File    History reviewed. No pertinent past medical history.  History reviewed. No pertinent surgical history.  Social History   Tobacco Use  . Smoking status: Never Smoker  . Smokeless tobacco: Never Used  Substance Use Topics  . Alcohol use: No    Family History  Problem Relation Age of Onset  . Healthy Mother   . Healthy Father   . Healthy Sister   . Healthy Brother     Review of Systems  Constitutional: Negative for chills, fever, malaise/fatigue and weight loss.  HENT: Negative for congestion, ear pain and sore throat.   Eyes: Negative for blurred vision and double vision.  Respiratory: Negative for cough and shortness of breath.   Cardiovascular: Negative for chest pain, palpitations and leg swelling.  Gastrointestinal: Negative for abdominal pain, constipation, diarrhea, nausea and vomiting.  Genitourinary: Negative for dysuria and hematuria.       Neg breast lumps or nipple discharge Neg pelvic pain, dyspareunia, abnormal vaginal bleeding  Musculoskeletal: Negative for joint pain and myalgias.  Skin: Negative for rash.  Neurological: Negative for dizziness,  tingling and headaches.  Endo/Heme/Allergies: Negative for polydipsia. Does not bruise/bleed easily.  Psychiatric/Behavioral: Negative for depression. The patient is not nervous/anxious.   All other systems reviewed and are negative.    OBJECTIVE:  Blood pressure 102/64, pulse 98, temperature 98.7 F (37.1 C), temperature source Oral, height 5\' 2"  (1.575 m), weight 104 lb 12.8 oz (47.5 kg), SpO2 100 %.  Physical Exam  Constitutional: She is oriented to person, place, and time and well-developed, well-nourished, and in no distress.  HENT:  Head: Normocephalic and atraumatic.  Right Ear: Hearing, tympanic membrane, external ear and ear canal normal.  Left Ear: Hearing, tympanic membrane, external ear and ear canal normal.  Mouth/Throat: Oropharynx is clear and moist.  Eyes: EOM are normal. Pupils are equal, round, and reactive to light.  Neck: Neck supple. No thyromegaly present.  Cardiovascular: Normal rate, regular rhythm, normal heart sounds and intact distal pulses. Exam reveals no gallop and no friction rub.  No murmur heard. Pulmonary/Chest: Effort normal and breath sounds normal. She has no wheezes. She has no rales.  Abdominal: Soft. Bowel sounds are normal. She exhibits no distension and no mass. There is no tenderness.  Genitourinary: Uterus is not enlarged, not fixed and not tender.  Cervix is not fixed. Cervix exhibits no motion tenderness and no lesion. Right adnexum displays no mass and no tenderness. Left adnexum displays no mass and no tenderness. Vulva exhibits no lesion and no rash. Vagina exhibits rugosity. Vagina exhibits no lesion. Thick  odorless  yellow  and vaginal discharge found.  Musculoskeletal: Normal range of motion. She exhibits no edema.  Lymphadenopathy:    She has no cervical adenopathy.  Neurological: She is alert and oriented to person, place, and time. She has normal reflexes. Gait normal.  Skin: Skin is warm and dry.  Psychiatric: Mood and affect  normal.  Nursing note and vitals reviewed.    Results for orders placed or performed in visit on 04/09/17 (from the past 24 hour(s))  POCT Wet + KOH Prep     Status: Abnormal   Collection Time: 04/09/17 10:11 AM  Result Value Ref Range   Yeast by KOH Absent Absent   Yeast by wet prep Absent Absent   WBC by wet prep Moderate (A) Few   Clue Cells Wet Prep HPF POC None None   Trich by wet prep Absent Absent   Bacteria Wet Prep HPF POC Many (A) Few   Epithelial Cells By [employer] Financial[employer]et Pref ([facility]) Many (A) None, Few, Too numerous to count   RBC,UR,HPF,POC None None RBC/hpf    ASSESSMENT and PLAN  1. Women's annual routine gynecological examination No concerns per history or exam. Routine HCM labs ordered. HCM reviewed/discussed. Anticipatory guidance regarding healthy weight, lifestyle and choices given.   - Pap IG, CT/NG w/ reflex HPV when ASC-U  2. Screening examination for STD (sexually transmitted disease) - Pap IG, CT/NG w/ reflex HPV when ASC-U - HIV antibody - RPR  3. Vaginal discharge Normal wet prep, reassured patient it seems to be physiologic - POCT Wet + KOH Prep  Return in about 1 year (around 04/10/2018).    Myles LippsIrma M Santiago, MD [facility] [address] [place], [place] [address] Ph.  [phone] Fax [phone]

## 2017-04-10 LAB — RPR: RPR Ser Ql: NONREACTIVE

## 2017-04-10 LAB — HIV ANTIBODY (ROUTINE TESTING W REFLEX): HIV Screen 4th Generation wRfx: NONREACTIVE

## 2017-04-14 ENCOUNTER — Encounter: Payer: Self-pay | Admitting: Family Medicine

## 2017-04-14 LAB — PAP IG, CT-NG, RFX HPV ASCU
Chlamydia, Nuc. Acid Amp: NEGATIVE
Gonococcus by Nucleic Acid Amp: NEGATIVE
PAP Smear Comment: 0

## 2017-04-18 ENCOUNTER — Emergency Department (HOSPITAL_COMMUNITY)
Admission: EM | Admit: 2017-04-18 | Discharge: 2017-04-18 | Disposition: A | Discharge status: Home / Self Care | Payer: BLUE CROSS/BLUE SHIELD | Attending: Emergency Medicine | Admitting: Emergency Medicine

## 2017-04-18 ENCOUNTER — Other Ambulatory Visit: Payer: Self-pay

## 2017-04-18 DIAGNOSIS — R300 Dysuria: Secondary | ICD-10-CM | POA: Diagnosis present

## 2017-04-18 DIAGNOSIS — N3001 Acute cystitis with hematuria: Secondary | ICD-10-CM | POA: Insufficient documentation

## 2017-04-18 DIAGNOSIS — E876 Hypokalemia: Secondary | ICD-10-CM | POA: Insufficient documentation

## 2017-04-18 LAB — COMPREHENSIVE METABOLIC PANEL
ALBUMIN: 3.8 g/dL (ref 3.5–5.0)
ALT: 10 U/L — ABNORMAL LOW (ref 14–54)
ANION GAP: 11 (ref 5–15)
AST: 19 U/L (ref 15–41)
Alkaline Phosphatase: 42 U/L (ref 38–126)
BILIRUBIN TOTAL: 0.2 mg/dL — AB (ref 0.3–1.2)
BUN: 8 mg/dL (ref 6–20)
CHLORIDE: 104 mmol/L (ref 101–111)
CO2: 22 mmol/L (ref 22–32)
Calcium: 9.1 mg/dL (ref 8.9–10.3)
Creatinine, Ser: 0.8 mg/dL (ref 0.44–1.00)
GFR calc Af Amer: >60 mL/min (ref >60)
GFR calc non Af Amer: >60 mL/min (ref >60)
Glucose, Bld: 113 mg/dL — ABNORMAL HIGH (ref 65–99)
POTASSIUM: 3.1 mmol/L — AB (ref 3.5–5.1)
SODIUM: 137 mmol/L (ref 135–145)
TOTAL PROTEIN: 6.7 g/dL (ref 6.5–8.1)

## 2017-04-18 LAB — URINALYSIS, ROUTINE W REFLEX MICROSCOPIC
Bilirubin Urine: NEGATIVE
GLUCOSE, UA: NEGATIVE mg/dL
KETONES UR: NEGATIVE mg/dL
Nitrite: NEGATIVE
PROTEIN: 100 mg/dL — AB
Specific Gravity, Urine: 1.018 (ref 1.005–1.030)
pH: 7 (ref 5.0–8.0)

## 2017-04-18 LAB — I-STAT BETA HCG BLOOD, ED (MC, WL, AP ONLY)

## 2017-04-18 LAB — CBC
HEMATOCRIT: 39.9 % (ref 36.0–46.0)
HEMOGLOBIN: 13.4 g/dL (ref 12.0–15.0)
MCH: 30 pg (ref 26.0–34.0)
MCHC: 33.6 g/dL (ref 30.0–36.0)
MCV: 89.3 fL (ref 78.0–100.0)
Platelets: 165 10*3/uL (ref 150–400)
RBC: 4.47 MIL/uL (ref 3.87–5.11)
RDW: 12.6 % (ref 11.5–15.5)
WBC: 12.6 10*3/uL — ABNORMAL HIGH (ref 4.0–10.5)

## 2017-04-18 LAB — LIPASE, BLOOD: Lipase: 32 U/L (ref 11–51)

## 2017-04-18 MED ORDER — POTASSIUM CHLORIDE CRYS ER 20 MEQ PO TBCR: 20.0000 meq | EXTENDED_RELEASE_TABLET | Freq: Every day | ORAL | 0 refills | Status: AC

## 2017-04-18 MED ORDER — CEPHALEXIN 500 MG PO CAPS
500.0000 mg | ORAL_CAPSULE | Freq: Once | ORAL | Status: AC
  Administered 2017-04-18: 500 mg via ORAL
  Filled 2017-04-18: qty 1

## 2017-04-18 MED ORDER — CEPHALEXIN 500 MG PO CAPS: 500.0000 mg | ORAL_CAPSULE | Freq: Four times a day (QID) | ORAL | 0 refills | Status: AC

## 2017-04-18 MED ORDER — HYDROCODONE-ACETAMINOPHEN 5-325 MG PO TABS
1.0000 | ORAL_TABLET | Freq: Once | ORAL | Status: AC
  Administered 2017-04-18: 1 via ORAL
  Filled 2017-04-18: qty 1

## 2017-04-18 MED ORDER — PHENAZOPYRIDINE HCL 200 MG PO TABS
200.0000 mg | ORAL_TABLET | Freq: Once | ORAL | Status: AC
  Administered 2017-04-18: 200 mg via ORAL
  Filled 2017-04-18: qty 1

## 2017-04-18 MED ORDER — PHENAZOPYRIDINE HCL 200 MG PO TABS: 200.0000 mg | ORAL_TABLET | Freq: Three times a day (TID) | ORAL | 0 refills | Status: AC | PRN

## 2017-04-18 MED ORDER — POTASSIUM CHLORIDE CRYS ER 20 MEQ PO TBCR
40.0000 meq | EXTENDED_RELEASE_TABLET | Freq: Once | ORAL | Status: AC
  Administered 2017-04-18: 40 meq via ORAL
  Filled 2017-04-18: qty 2

## 2017-04-18 NOTE — ED Provider Notes (Signed)
[facility] EMERGENCY DEPARTMENT Provider Note   CSN: [id number] Arrival date & time: 04/18/17  8469     History   Chief Complaint Chief Complaint  Patient presents with  . Abdominal Pain  . Urinary Frequency    HPI Latasha Kramer is a 27 y.o. female with a hx of no major medical problems presents to the Emergency Department complaining of gradual, persistent, progressively worsening dysuria onset 10pm. Associated symptoms include urinary urgency and frequency worsening throughout the evening.  She has had 1 episode of hematuria.  She also developed suprapubic abd pain tonight.  Pt reports similar symptoms with recent UTI.  LMP: 03/30/17.  Pt is sexually active with 1 female partner and regularly uses condoms.  No change or increase in vaginal discharge.  Urination makes her symptoms significantly worse and nothing makes it better.  Pt denies fever, chills, headache, neck pain, chest pain, N/V/D, weakness, dizziness, syncope.      The history is provided by the patient and medical records. No language interpreter was used.    No past medical history on file.  There are no active problems to display for this patient.   No past surgical history on file.  OB History    Gravida  2   Para  2   Term  2   Preterm  0   AB  0   Living  2     SAB  0   TAB  0   Ectopic  0   Multiple  0   Live Births  1            Home Medications    Prior to Admission medications   Medication Sig Start Date End Date Taking? Authorizing Provider  cephALEXin (KEFLEX) 500 MG capsule Take 1 capsule (500 mg total) by mouth 4 (four) times daily for 7 days. 04/18/17 04/25/17  Taiwan Talcott, Dahlia Client, PA-C  phenazopyridine (PYRIDIUM) 200 MG tablet Take 1 tablet (200 mg total) by mouth 3 (three) times daily as needed for pain. 04/18/17   Rickeya Manus, Dahlia Client, PA-C  potassium chloride SA (K-DUR,KLOR-CON) 20 MEQ tablet Take 1 tablet (20 mEq total) by mouth daily. 04/18/17   Albi Rappaport,  Dahlia Client, PA-C    Family History Family History  Problem Relation Age of Onset  . Healthy Mother   . Healthy Father   . Healthy Sister   . Healthy Brother     Social History Social History   Tobacco Use  . Smoking status: Never Smoker  . Smokeless tobacco: Never Used  Substance Use Topics  . Alcohol use: No  . Drug use: No     Allergies   Patient has no known allergies.   Review of Systems Review of Systems  Constitutional: Negative for appetite change, diaphoresis, fatigue, fever and unexpected weight change.  HENT: Negative for mouth sores.   Eyes: Negative for visual disturbance.  Respiratory: Negative for cough, chest tightness, shortness of breath and wheezing.   Cardiovascular: Negative for chest pain.  Gastrointestinal: Positive for abdominal pain. Negative for constipation, diarrhea, nausea and vomiting.  Endocrine: Negative for polydipsia, polyphagia and polyuria.  Genitourinary: Positive for dysuria, frequency, hematuria and urgency. Negative for decreased urine volume and flank pain.  Musculoskeletal: Negative for back pain and neck stiffness.  Skin: Negative for rash.  Allergic/Immunologic: Negative for immunocompromised state.  Neurological: Negative for syncope, light-headedness and headaches.  Hematological: Does not bruise/bleed easily.  Psychiatric/Behavioral: Negative for sleep disturbance. The patient is not nervous/anxious.  Physical Exam Updated Vital Signs BP 131/88 (BP Location: Right Arm)   Pulse (!) 110   Temp 97.8 F (36.6 C) (Oral)   Resp 16   Ht 5\' 1"  (1.549 m)   Wt 47.2 kg (104 lb)   SpO2 100%   BMI 19.65 kg/m   Physical Exam  Constitutional: She appears well-developed and well-nourished. No distress.  Awake, alert, nontoxic appearance  HENT:  Head: Normocephalic and atraumatic.  Mouth/Throat: No oropharyngeal exudate.  Eyes: Conjunctivae are normal. No scleral icterus.  Neck: Normal range of motion.  Cardiovascular:  Normal rate, regular rhythm, normal heart sounds and intact distal pulses.  Pulses:      Radial pulses are 2+ on the right side, and 2+ on the left side.  Pulmonary/Chest: Effort normal and breath sounds normal. No respiratory distress. She has no wheezes.  Equal chest expansion  Abdominal: Soft. Bowel sounds are normal. She exhibits no distension and no mass. There is tenderness in the suprapubic area. There is no rebound, no guarding and no CVA tenderness.  Musculoskeletal: Normal range of motion. She exhibits no edema.  Neurological: She is alert.  Speech is clear and goal oriented Moves extremities without ataxia  Skin: Skin is warm and dry. No rash noted. She is not diaphoretic.  Psychiatric: She has a normal mood and affect.  Nursing note and vitals reviewed.    ED Treatments / Results  Labs (all labs ordered are listed, but only abnormal results are displayed) Labs Reviewed  COMPREHENSIVE METABOLIC PANEL - Abnormal; Notable for the following components:      Result Value   Potassium 3.1 (*)    Glucose, Bld 113 (*)    ALT 10 (*)    Total Bilirubin 0.2 (*)    All other components within normal limits  CBC - Abnormal; Notable for the following components:   WBC 12.6 (*)    All other components within normal limits  URINALYSIS, ROUTINE W REFLEX MICROSCOPIC - Abnormal; Notable for the following components:   Color, Urine AMBER (*)    APPearance CLOUDY (*)    Hgb urine dipstick LARGE (*)    Protein, ur 100 (*)    Leukocytes, UA LARGE (*)    Bacteria, UA FEW (*)    Squamous Epithelial / LPF 0-5 (*)    Non Squamous Epithelial 0-5 (*)    All other components within normal limits  URINE CULTURE  LIPASE, BLOOD  I-STAT BETA HCG BLOOD, ED ([facility], [facility], [facility] ONLY)    Procedures Procedures (including critical care time)  Medications Ordered in ED Medications  HYDROcodone-acetaminophen (NORCO/VICODIN) 5-325 MG per tablet 1 tablet (has no administration in time range)    phenazopyridine (PYRIDIUM) tablet 200 mg (has no administration in time range)  cephALEXin (KEFLEX) capsule 500 mg (has no administration in time range)  potassium chloride SA (K-DUR,KLOR-CON) CR tablet 40 mEq (has no administration in time range)     Initial Impression / Assessment and Plan / ED Course  I have reviewed the triage vital signs and the nursing notes.  Pertinent labs & imaging results that were available during my care of the patient were reviewed by me and considered in my medical decision making (see chart for details).  Clinical Course as of Apr 19 414  Fri Apr 18, 2017  0412 Elevated  WBC(!): 12.6 [HM]  0413 I-stat hCG, quantitative: <5.0 [HM]  0413 Potassium(!): 3.1 [HM]  0413 Asked him given in the emergency department.  Will discharge home with  prescription.  Potassium(!): 3.1 [HM]  0413 Tachycardia at triage  Pulse Rate(!): 110 [HM]  0413 Improvement in heart rate  Pulse Rate: 94 [HM]  0413 Urine with leukocytes, white blood cell clumps and hemoglobin consistent with UTI.  No flank pain or CVA tenderness to suggest stone disease.  Leukocytes, UA(!): LARGE [HM]    Clinical Course User Index [HM] Yorley Buch, Dahlia ClientHannah, PA-C    Pt has been diagnosed with a UTI. Pt is afebrile, no CVA tenderness, normotensive, and denies N/V.  Pt tachycardic upon arrival, but this had resolved prior to my evaluation.  Pt to be discharged home with antibiotics and instructions to follow up with PCP if symptoms persist.  She is noted to have mild hypokalemia.  Replacement started in the emergency department and patient discharged home with a prescription for additional doses. Pt ans significant other state understanding and are in agreement with the plan.   BP 106/72 (BP Location: Right Arm)   Pulse 94   Temp 98.6 F (37 C) (Oral)   Resp 20   Ht 5\' 1"  (1.549 m)   Wt 47.2 kg (104 lb)   SpO2 100%   BMI 19.65 kg/m     Final Clinical Impressions(s) / ED Diagnoses   Final  diagnoses:  Acute cystitis with hematuria  Hypokalemia    ED Discharge Orders        Ordered    cephALEXin (KEFLEX) 500 MG capsule  4 times daily     04/18/17 0415    potassium chloride SA (K-DUR,KLOR-CON) 20 MEQ tablet  Daily     04/18/17 0415    phenazopyridine (PYRIDIUM) 200 MG tablet  3 times daily PRN     04/18/17 0415       Lakeeta Dobosz, Dahlia ClientHannah, PA-C 04/18/17 0440    Ward, Layla MawKristen N, DO 04/18/17 0451

## 2017-04-18 NOTE — Discharge Instructions (Addendum)
1. Medications: Keflex, pyridium, Klor Con; usual home medications 2. Treatment: rest, drink plenty of fluids, take medications as prescribed 3. Follow Up: Please followup with your primary doctor in 3 days for discussion of your diagnoses and further evaluation after today's visit; if you do not have a primary care doctor use the resource guide provided to find one; return to the ER for fevers, persistent vomiting, worsening abdominal pain or other concerning symptoms.

## 2017-04-18 NOTE — ED Triage Notes (Signed)
Pt reports she has abdominal pain that started around 10pm.  The pain is so great she is unable to rest.  Pt also reports frequent and painful urination.

## 2017-04-18 NOTE — ED Notes (Signed)
Sent requisition to lab for urine culture.  Lab aware requisition being sent.

## 2017-04-18 NOTE — ED Notes (Signed)
Ok to discharge patient per PA. 

## 2017-04-20 LAB — URINE CULTURE

## 2017-04-21 ENCOUNTER — Telehealth: Payer: Self-pay | Admitting: *Deleted

## 2017-04-21 NOTE — Telephone Encounter (Signed)
Post ED Visit - Positive Culture Follow-up  Culture report reviewed by antimicrobial stewardship pharmacist:  []  Latasha Kramer, Pharm.D. []  Latasha Kramer, Pharm.D., BCPS AQ-ID []  Latasha Kramer, Pharm.D., BCPS [x]  Latasha Kramer, [address] BCPS []  Latasha Kramer, [address] BCPS, AAHIVP []  Latasha Kramer, Pharm.D., BCPS, AAHIVP []  Latasha Kramer, PharmD, BCPS []  Latasha Kramer, PharmD []  Latasha Kramer, PharmD, BCPS  Positive urine culture Treated with Cephalexin, organism sensitive to the same and no further patient follow-up is required at this time.  Latasha Kramer, [facility]ramer [facility] 04/21/2017, 10:02 AM

## 2017-04-23 ENCOUNTER — Encounter: Payer: Self-pay | Admitting: Obstetrics and Gynecology

## 2017-04-26 ENCOUNTER — Encounter: Payer: Self-pay | Admitting: Physician Assistant

## 2017-04-26 ENCOUNTER — Other Ambulatory Visit: Payer: Self-pay

## 2017-04-26 ENCOUNTER — Ambulatory Visit: Payer: BLUE CROSS/BLUE SHIELD | Admitting: Physician Assistant

## 2017-04-26 ENCOUNTER — Telehealth: Payer: Self-pay | Admitting: *Deleted

## 2017-04-26 VITALS — BP 111/75 | HR 84 | Temp 98.9°F | Resp 16 | Ht 61.25 in | Wt 105.6 lb

## 2017-04-26 DIAGNOSIS — R350 Frequency of micturition: Secondary | ICD-10-CM

## 2017-04-26 DIAGNOSIS — N3000 Acute cystitis without hematuria: Secondary | ICD-10-CM

## 2017-04-26 LAB — POCT URINALYSIS DIP (MANUAL ENTRY)
BILIRUBIN UA: NEGATIVE
Glucose, UA: NEGATIVE mg/dL
Ketones, POC UA: NEGATIVE mg/dL
NITRITE UA: POSITIVE — AB
PH UA: 6.5 (ref 5.0–8.0)
PROTEIN UA: NEGATIVE mg/dL
Spec Grav, UA: 1.02 (ref 1.010–1.025)
UROBILINOGEN UA: 0.2 U/dL

## 2017-04-26 LAB — POC MICROSCOPIC URINALYSIS (UMFC): Mucus: ABSENT

## 2017-04-26 MED ORDER — NITROFURANTOIN MONOHYD MACRO 100 MG PO CAPS: 100.0000 mg | ORAL_CAPSULE | Freq: Two times a day (BID) | ORAL | 0 refills | Status: DC

## 2017-04-26 NOTE — Patient Instructions (Addendum)
  Take blue/green medication 4 times a day for the next 6 days, until bottle is empty. This will help clear your infection. Return here in 5 days, we will recheck your potassium at that time. If your symptoms worsen while you are awaiting these results or you develop fever, chills, flank pian, nausea and vomiting, please seek care immediately.    Urinary Tract Infection, Adult A urinary tract infection (UTI) is an infection of any part of the urinary tract. The urinary tract includes the:  Kidneys.  Ureters.  Bladder.  Urethra.  These organs make, store, and get rid of pee (urine) in the body. Follow these instructions at home:  Take over-the-counter and prescription medicines only as told by your doctor.  If you were prescribed an antibiotic medicine, take it as told by your doctor. Do not stop taking the antibiotic even if you start to feel better.  Avoid the following drinks: ? Alcohol. ? Caffeine. ? Tea. ? Carbonated drinks.  Drink enough fluid to keep your pee clear or pale yellow.  Keep all follow-up visits as told by your doctor. This is important.  Make sure to: ? Empty your bladder often and completely. Do not to hold pee for long periods of time. ? Empty your bladder before and after sex. ? Wipe from front to back after a bowel movement if you are female. Use each tissue one time when you wipe. Contact a doctor if:  You have back pain.  You have a fever.  You feel sick to your stomach (nauseous).  You throw up (vomit).  Your symptoms do not get better after 3 days.  Your symptoms go away and then come back. Get help right away if:  You have very bad back pain.  You have very bad lower belly (abdominal) pain.  You are throwing up and cannot keep down any medicines or water. This information is not intended to replace advice given to you by your health care provider. Make sure you discuss any questions you have with your health care provider. Document  Released: 07/03/2007 Document Revised: 06/22/2015 Document Reviewed: 12/05/2014 Elsevier Interactive Patient Education  2018 [employer]    IF you received an x-ray today, you will receive an invoice from [facility]. Please contact [facility] at [phone] with questions or concerns regarding your invoice.   IF you received labwork today, you will receive an invoice from [facility]. Please contact [facility] at [phone] with questions or concerns regarding your invoice.   Our billing staff will not be able to assist you with questions regarding bills from these companies.  You will be contacted with the lab results as soon as they are available. The fastest way to get your results is to activate your My Chart account. Instructions are located on the last page of this paperwork. If you have not heard from us regarding the results in 2 weeks, please contact this office.

## 2017-04-26 NOTE — Progress Notes (Signed)
04/26/2017 at 11:56 AM  Latasha Kramer / DOB: 05-25-1990 / MRN: [id number]  The patient does not have a problem list on file.  SUBJECTIVE  Latasha Kramer is a 27 y.o. female who complains of continued urinary frequency, dysuria, and hematuria x 1 week. Pt was seen in ED on 04/18/17 days ago for sx and dx with low potassium and UTI. Given Rx for potassium, keflex, and pyridium. Urine culture grew e.coli, suspectible to keflex. Pt took the three tablets of potassium with keflex and then stopped all medication. Her symptoms have continued. . She denies flank pain, abdominal pain and vaginal discharge.    She  has no past medical history on file.    Medications reviewed and updated by myself where necessary, and exist elsewhere in the encounter.   Latasha Kramer has No Known Allergies. She  reports that she has never smoked. She has never used smokeless tobacco. She reports that she does not drink alcohol or use drugs. She  reports that she currently engages in sexual activity. The patient  has no past surgical history on file.  Her family history includes Healthy in her brother, father, mother, and sister.  Review of Systems  Constitutional: Negative for chills and fever.  Cardiovascular: Negative for chest pain and palpitations.  Gastrointestinal: Negative for nausea and vomiting.    OBJECTIVE  Her  height is 5' 1.25" (1.556 m) and weight is 105 lb 9.6 oz (47.9 kg). Her oral temperature is 98.9 F (37.2 C). Her blood pressure is 111/75 and her pulse is 84. Her respiration is 16 and oxygen saturation is 97%.  The patient's body mass index is 19.79 kg/m.  Physical Exam  Constitutional: She is oriented to person, place, and time. She appears well-developed and well-nourished. No distress.  HENT:  Head: Normocephalic and atraumatic.  Eyes: Conjunctivae are normal.  Neck: Normal range of motion.  Pulmonary/Chest: Effort normal.  Abdominal: Soft. Normal appearance. There is no tenderness. There is no CVA  tenderness.  Neurological: She is alert and oriented to person, place, and time.  Skin: Skin is warm and dry.  Psychiatric: She has a normal mood and affect.  Vitals reviewed.   Results for orders placed or performed in visit on 04/26/17 (from the past 24 hour(s))  POCT urinalysis dipstick     Status: Abnormal   Collection Time: 04/26/17 11:29 AM  Result Value Ref Range   Color, UA yellow yellow   Clarity, UA clear clear   Glucose, UA negative negative mg/dL   Bilirubin, UA negative negative   Ketones, POC UA negative negative mg/dL   Spec Grav, UA 1.191 4.782 - 1.025   Blood, UA trace-intact (A) negative   pH, UA 6.5 5.0 - 8.0   Protein Ur, POC negative negative mg/dL   Urobilinogen, UA 0.2 0.2 or 1.0 E.U./dL   Nitrite, UA Positive (A) Negative   Leukocytes, UA Large (3+) (A) Negative  POCT Microscopic Urinalysis ([facility])     Status: Abnormal   Collection Time: 04/26/17 11:42 AM  Result Value Ref Range   WBC,UR,HPF,POC Moderate (A) None WBC/hpf   RBC,UR,HPF,POC None None RBC/hpf   Bacteria None None, Too numerous to count   Mucus Absent Absent   Epithelial Cells, UR Per Microscopy Few (A) None, Too numerous to count cells/hpf    ASSESSMENT & PLAN  [place] was seen today for urinary frequency and hematuria.  Diagnoses and all orders for this visit:  Acute cystitis without hematuria  Urinary frequency -  POCT urinalysis dipstick -     POCT Microscopic Urinalysis ([facility]) -     Cancel: Urine Culture   Pt had Rx bottles with her from her previous ED visit on 04/18/17. Potassium bottle was empty. Keflex bottle with #23 tablets left out of total #28 prescribed. UA still consistent with UTI.  Informed pt of urine culture results and UTI diagnosis. Explained why ED gave her Rx for potassium and keflex. Educated pt to take the remainder of keflex tablets as prescribed as this is the treatment for UTI. She states she was confused at the ED and did not realize she had to take all the  tablets. Plan to follow up in 5 days and recheck symptoms. Will recheck potassium at that visit. The patient was advised to call or come back to clinic if she does not see an improvement in symptoms, or worsens with the above plan.   A total of 15 was spent in the room with the patient, greater than 50% of which was in counseling/coordination of care regarding UTI treatment.  Benjiman CoreBrittany Siah Steely, PA-C  [facility] 04/26/2017 11:56 AM

## 2017-04-26 NOTE — Telephone Encounter (Signed)
[facility], cancelled Rx for SunGardMacrobid, per Colgate PalmoliveBrittnay Wiseman. Spoke to [place].

## 2017-04-27 LAB — URINE CULTURE: ORGANISM ID, BACTERIA: NO GROWTH

## 2017-04-29 ENCOUNTER — Telehealth: Payer: Self-pay | Admitting: Physician Assistant

## 2017-04-29 NOTE — Telephone Encounter (Signed)
Pt. Given lab results - verbalizes understanding. 

## 2017-05-01 ENCOUNTER — Encounter: Payer: Self-pay | Admitting: Physician Assistant

## 2017-05-01 ENCOUNTER — Other Ambulatory Visit: Payer: Self-pay

## 2017-05-01 ENCOUNTER — Ambulatory Visit (INDEPENDENT_AMBULATORY_CARE_PROVIDER_SITE_OTHER): Payer: BLUE CROSS/BLUE SHIELD | Admitting: Physician Assistant

## 2017-05-01 VITALS — BP 98/60 | HR 80 | Temp 98.6°F | Resp 18 | Ht 61.25 in | Wt 106.6 lb

## 2017-05-01 DIAGNOSIS — E876 Hypokalemia: Secondary | ICD-10-CM

## 2017-05-01 DIAGNOSIS — N3001 Acute cystitis with hematuria: Secondary | ICD-10-CM

## 2017-05-01 LAB — BASIC METABOLIC PANEL
BUN / CREAT RATIO: 9 (ref 9–23)
BUN: 6 mg/dL (ref 6–20)
CO2: 21 mmol/L (ref 20–29)
CREATININE: 0.66 mg/dL (ref 0.57–1.00)
Calcium: 8.9 mg/dL (ref 8.7–10.2)
Chloride: 105 mmol/L (ref 96–106)
GFR, EST AFRICAN AMERICAN: 142 mL/min/{1.73_m2} (ref >59)
GFR, EST NON AFRICAN AMERICAN: 123 mL/min/{1.73_m2} (ref >59)
Glucose: 75 mg/dL (ref 65–99)
Potassium: 4.2 mmol/L (ref 3.5–5.2)
SODIUM: 140 mmol/L (ref 134–144)

## 2017-05-01 NOTE — Progress Notes (Signed)
   Latasha Kramer  MRN: [id number] DOB: 1990/06/24  Subjective:  Latasha Kramer is a 27 y.o. female seen in office today for a chief complaint of follow-up on UTI and low potassium.  Patient was seen last in office on 03/30/17.  At this time she was continuing to have UTI symptoms from her ED visit.  She was explained how to use medication and encouraged to follow-up to ensure symptoms resolved and repeat lab work from ED.  Today, she notes that she completed antibiotic course and is feeling much better.  Her urinary symptoms have completely resolved. Denies urinary frequency, dysuria, hematuria, urinary urgency, flank pain, pelvic pain, nausea, vomiting, chest pain, palpitations, weakness, tingling, fatigue, and confusion.  Review of Systems  Per HPI   There are no active problems to display for this patient.   Current Outpatient Medications on File Prior to Visit  Medication Sig Dispense Refill  . cephALEXin (KEFLEX) 500 MG capsule Take 500 mg by mouth 4 (four) times daily.    . phenazopyridine (PYRIDIUM) 200 MG tablet Take 1 tablet (200 mg total) by mouth 3 (three) times daily as needed for pain. 6 tablet 0  . potassium chloride SA (K-DUR,KLOR-CON) 20 MEQ tablet Take 1 tablet (20 mEq total) by mouth daily. 3 tablet 0   No current facility-administered medications on file prior to visit.     No Known Allergies   Objective:  BP 98/60   Pulse 80   Temp 98.6 F (37 C) (Oral)   Resp 18   Ht 5' 1.25" (1.556 m)   Wt 106 lb 9.6 oz (48.4 kg)   SpO2 100%   BMI 19.98 kg/m    Physical Exam  Constitutional: She is oriented to person, place, and time and well-developed, well-nourished, and in no distress.  HENT:  Head: Normocephalic and atraumatic.  Eyes: Conjunctivae are normal.  Neck: Normal range of motion.  Cardiovascular: Normal rate, regular rhythm and normal heart sounds.  Pulmonary/Chest: Effort normal.  Neurological: She is alert and oriented to person, place, and time. Gait normal.    Skin: Skin is warm and dry.  Psychiatric: Affect normal.  Vitals reviewed.    Assessment and Plan :  1. Low blood potassium Potassium level in ED on 04/18/17 was 3.1.  She completed 3 days worth of potassium 20 mEq.  Will repeat BMP today.  Given educational  material on foods high in potassium. - Basic metabolic panel 2. Acute cystitis with hematuria Resolved.   Benjiman CoreBrittany Piedad Standiford PA-C  [facility]  [facility] 05/02/2017 2:58 PM

## 2017-05-01 NOTE — Patient Instructions (Addendum)
Glad to see you are feeling better!!  We will contact you with your lab results.   Potassium Content of Foods Potassium is a mineral found in many foods and drinks. It helps keep fluids and minerals balanced in your body and affects how steadily your heart beats. Potassium also helps control your blood pressure and keep your muscles and nervous system healthy. Certain health conditions and medicines may change the balance of potassium in your body. When this happens, you can help balance your level of potassium through the foods that you do or do not eat. Your health care provider or dietitian may recommend an amount of potassium that you should have each day. The following lists of foods provide the amount of potassium (in parentheses) per serving in each item. High in potassium The following foods and beverages have 200 mg or more of potassium per serving:  Apricots, 2 raw or 5 dry (200 mg).  Artichoke, 1 medium (345 mg).  Avocado, raw,  each (245 mg).  Banana, 1 medium (425 mg).  Beans, lima, or baked beans, canned,  cup (280 mg).  Beans, white, canned,  cup (595 mg).  Beef roast, 3 oz (320 mg).  Beef, ground, 3 oz (270 mg).  Beets, raw or cooked,  cup (260 mg).  Bran muffin, 2 oz (300 mg).  Broccoli,  cup (230 mg).  Brussels sprouts,  cup (250 mg).  Cantaloupe,  cup (215 mg).  Cereal, 100% bran,  cup (200-400 mg).  Cheeseburger, single, fast food, 1 each (225-400 mg).  Chicken, 3 oz (220 mg).  Clams, canned, 3 oz (535 mg).  Crab, 3 oz (225 mg).  Dates, 5 each (270 mg).  Dried beans and peas,  cup (300-475 mg).  Figs, dried, 2 each (260 mg).  Fish: halibut, tuna, cod, snapper, 3 oz (480 mg).  Fish: salmon, haddock, swordfish, perch, 3 oz (300 mg).  Fish, tuna, canned 3 oz (200 mg).  [place] fries, fast food, 3 oz (470 mg).  Granola with fruit and nuts,  cup (200 mg).  Grapefruit juice,  cup (200 mg).  Greens, beet,  cup (655  mg).  Honeydew melon,  cup (200 mg).  Kale, raw, 1 cup (300 mg).  Kiwi, 1 medium (240 mg).  Kohlrabi, rutabaga, parsnips,  cup (280 mg).  Lentils,  cup (365 mg).  Mango, 1 each (325 mg).  Milk, chocolate, 1 cup (420 mg).  Milk: nonfat, low-fat, whole, buttermilk, 1 cup (350-380 mg).  Molasses, 1 Tbsp (295 mg).  Mushrooms,  cup (280) mg.  Nectarine, 1 each (275 mg).  Nuts: almonds, peanuts, hazelnuts, [place], cashew, mixed, 1 oz (200 mg).  Nuts, pistachios, 1 oz (295 mg).  Orange, 1 each (240 mg).  Orange juice,  cup (235 mg).  Papaya, medium,  fruit (390 mg).  Peanut butter, chunky, 2 Tbsp (240 mg).  Peanut butter, smooth, 2 Tbsp (210 mg).  Pear, 1 medium (200 mg).  Pomegranate, 1 whole (400 mg).  Pomegranate juice,  cup (215 mg).  Pork, 3 oz (350 mg).  Potato chips, salted, 1 oz (465 mg).  Potato, baked with skin, 1 medium (925 mg).  Potatoes, boiled,  cup (255 mg).  Potatoes, mashed,  cup (330 mg).  Prune juice,  cup (370 mg).  Prunes, 5 each (305 mg).  Pudding, chocolate,  cup (230 mg).  Pumpkin, canned,  cup (250 mg).  Raisins, seedless,  cup (270 mg).  Seeds, sunflower or pumpkin, 1 oz (240 mg).  Soy milk, 1  cup (300 mg).  Spinach,  cup (420 mg).  Spinach, canned,  cup (370 mg).  Sweet potato, baked with skin, 1 medium (450 mg).  Swiss chard,  cup (480 mg).  Tomato or vegetable juice,  cup (275 mg).  Tomato sauce or puree,  cup (400-550 mg).  Tomato, raw, 1 medium (290 mg).  Tomatoes, canned,  cup (200-300 mg).  [place]urkey, 3 oz (250 mg).  Wheat germ, 1 oz (250 mg).  Winter squash,  cup (250 mg).  Yogurt, plain or fruited, 6 oz (260-435 mg).  Zucchini,  cup (220 mg).  Moderate in potassium The following foods and beverages have 50-200 mg of potassium per serving:  Apple, 1 each (150 mg).  Apple juice,  cup (150 mg).  Applesauce,  cup (90 mg).  Apricot nectar,  cup (140 mg).  Asparagus,  small spears,  cup or 6 spears (155 mg).  Bagel, cinnamon raisin, 1 each (130 mg).  Bagel, egg or plain, 4 in., 1 each (70 mg).  Beans, green,  cup (90 mg).  Beans, yellow,  cup (190 mg).  Beer, regular, 12 oz (100 mg).  Beets, canned,  cup (125 mg).  Blackberries,  cup (115 mg).  Blueberries,  cup (60 mg).  Bread, whole wheat, 1 slice (70 mg).  Broccoli, raw,  cup (145 mg).  Cabbage,  cup (150 mg).  Carrots, cooked or raw,  cup (180 mg).  Cauliflower, raw,  cup (150 mg).  Celery, raw,  cup (155 mg).  Cereal, bran flakes, cup (120-150 mg).  Cheese, cottage,  cup (110 mg).  Cherries, 10 each (150 mg).  Chocolate, 1 oz bar (165 mg).  Coffee, brewed 6 oz (90 mg).  Corn,  cup or 1 ear (195 mg).  Cucumbers,  cup (80 mg).  Egg, large, 1 each (60 mg).  Eggplant,  cup (60 mg).  Endive, raw, cup (80 mg).  English muffin, 1 each (65 mg).  Fish, orange roughy, 3 oz (150 mg).  Frankfurter, beef or pork, 1 each (75 mg).  Fruit cocktail,  cup (115 mg).  Grape juice,  cup (170 mg).  Grapefruit,  fruit (175 mg).  Grapes,  cup (155 mg).  Greens: kale, turnip, collard,  cup (110-150 mg).  Ice cream or frozen yogurt, chocolate,  cup (175 mg).  Ice cream or frozen yogurt, vanilla,  cup (120-150 mg).  Lemons, limes, 1 each (80 mg).  Lettuce, all types, 1 cup (100 mg).  Mixed vegetables,  cup (150 mg).  Mushrooms, raw,  cup (110 mg).  Nuts: walnuts, pecans, or macadamia, 1 oz (125 mg).  Oatmeal,  cup (80 mg).  Okra,  cup (110 mg).  Onions, raw,  cup (120 mg).  Peach, 1 each (185 mg).  Peaches, canned,  cup (120 mg).  Pears, canned,  cup (120 mg).  Peas, green, frozen,  cup (90 mg).  Peppers, green,  cup (130 mg).  Peppers, red,  cup (160 mg).  Pineapple juice,  cup (165 mg).  Pineapple, fresh or canned,  cup (100 mg).  Plums, 1 each (105 mg).  Pudding, vanilla,  cup (150 mg).  Raspberries,  cup  (90 mg).  Rhubarb,  cup (115 mg).  Rice, wild,  cup (80 mg).  Shrimp, 3 oz (155 mg).  Spinach, raw, 1 cup (170 mg).  Strawberries,  cup (125 mg).  Summer squash  cup (175-200 mg).  Swiss chard, raw, 1 cup (135 mg).  Tangerines, 1 each (140 mg).  Tea, brewed, 6 oz (  65 mg).  Turnips,  cup (140 mg).  Watermelon,  cup (85 mg).  Wine, red, table, 5 oz (180 mg).  Wine, white, table, 5 oz (100 mg).  Low in potassium The following foods and beverages have less than 50 mg of potassium per serving.  Bread, white, 1 slice (30 mg).  Carbonated beverages, 12 oz (less than 5 mg).  Cheese, 1 oz (20-30 mg).  Cranberries,  cup (45 mg).  Cranberry juice cocktail,  cup (20 mg).  Fats and oils, 1 Tbsp (less than 5 mg).  Hummus, 1 Tbsp (32 mg).  Nectar: papaya, mango, or pear,  cup (35 mg).  Rice, white or brown,  cup (50 mg).  Spaghetti or macaroni,  cup cooked (30 mg).  Tortilla, flour or corn, 1 each (50 mg).  Waffle, 4 in., 1 each (50 mg).  Water chestnuts,  cup (40 mg).  This information is not intended to replace advice given to you by your health care provider. Make sure you discuss any questions you have with your health care provider. Document Released: 08/28/2004 Document Revised: 06/22/2015 Document Reviewed: 12/11/2012 Elsevier Interactive Patient Education  2018 [employer].   IF you received an x-ray today, you will receive an invoice from [facility]. Please contact [facility] at [phone] with questions or concerns regarding your invoice.   IF you received labwork today, you will receive an invoice from [place]. Please contact [facility] at [phone] with questions or concerns regarding your invoice.   Our billing staff will not be able to assist you with questions regarding bills from these companies.  You will be contacted with the lab results as soon as they are available. The fastest way to get your results is to  activate your My Chart account. Instructions are located on the last page of this paperwork. If you have not heard from [place] regarding the results in 2 weeks, please contact this office.

## 2017-05-02 ENCOUNTER — Other Ambulatory Visit: Payer: Self-pay | Admitting: Physician Assistant

## 2017-05-02 ENCOUNTER — Encounter: Payer: Self-pay | Admitting: Physician Assistant

## 2017-05-05 ENCOUNTER — Encounter: Payer: Self-pay | Admitting: *Deleted

## 2019-01-29 NOTE — L&D Delivery Note (Signed)
OB/GYN Faculty Practice Delivery Note  Latasha Kramer is a 29 y.o. G3P2002 s/p SVD at [redacted]w[redacted]d. She was admitted in active labor.   ROM: 0h 75m with clear fluid GBS Status: Negative  Labor Progress: . Patient presented to MAU in active labor, 8/100/bulging bag. She was immediately admitted to L&D. SROM at 0717, noted to be complete and pushing. Precipitous delivery within 30 minutes of arrival.  Delivery Date/Time: 09/08/19 0721 Delivery: Called to room and patient was complete and pushing. Head delivered LOA. No nuchal cord present. Shoulder and body delivered in usual fashion. Infant with spontaneous cry, placed on mother's abdomen, dried and stimulated. Cord clamped x 2 after 1-minute delay, and cut by FOB under my direct supervision. Cord blood drawn. Placenta delivered spontaneously with gentle cord traction. Fundus firm with massage and Pitocin. Labia, perineum, vagina, and cervix were inspected. A 1st degree perineal laceration was present but hemostatic without repair.    Placenta: Intact, 3 vessel cord, delivered spontaneously Complications: None Lacerations: 1st degree perineal, hemostatic without repair1 EBL: 100 Analgesia: None  Infant: Gril  APGARs 8 and 9  Weight pending  Nelda Bucks, MD Family Medicine PGY-3

## 2019-02-09 ENCOUNTER — Ambulatory Visit: Payer: BLUE CROSS/BLUE SHIELD | Admitting: Adult Health Nurse Practitioner

## 2019-04-07 DIAGNOSIS — Z3480 Encounter for supervision of other normal pregnancy, unspecified trimester: Secondary | ICD-10-CM | POA: Insufficient documentation

## 2019-04-08 ENCOUNTER — Other Ambulatory Visit: Payer: Self-pay

## 2019-04-08 ENCOUNTER — Inpatient Hospital Stay (HOSPITAL_COMMUNITY): Admit: 2019-04-08 | Payer: BLUE CROSS/BLUE SHIELD

## 2019-04-08 ENCOUNTER — Ambulatory Visit (INDEPENDENT_AMBULATORY_CARE_PROVIDER_SITE_OTHER): Payer: BLUE CROSS/BLUE SHIELD | Admitting: Advanced Practice Midwife

## 2019-04-08 ENCOUNTER — Encounter: Payer: Self-pay | Admitting: Advanced Practice Midwife

## 2019-04-08 DIAGNOSIS — Z3480 Encounter for supervision of other normal pregnancy, unspecified trimester: Secondary | ICD-10-CM

## 2019-04-08 DIAGNOSIS — Z3481 Encounter for supervision of other normal pregnancy, first trimester: Secondary | ICD-10-CM | POA: Diagnosis not present

## 2019-04-08 MED ORDER — PRENATE MINI 18-0.6-0.4-350 MG PO CAPS: 1.0000 | ORAL_CAPSULE | Freq: Every day | ORAL | 11 refills | Status: AC

## 2019-04-08 MED ORDER — BLOOD PRESSURE KIT DEVI: 1.0000 | 0 refills | Status: AC

## 2019-04-08 MED ORDER — BLOOD PRESSURE KIT DEVI: 1.0000 | 0 refills | Status: DC

## 2019-04-08 NOTE — Progress Notes (Addendum)
New OB.  Declined FLU vaccine

## 2019-04-08 NOTE — Progress Notes (Signed)
Subjective:   Latasha Kramer is a 29 y.o. G3P2002 at [redacted]w[redacted]d by LMP being seen today for her first obstetrical visit.  Her obstetrical history is significant for NSVD at term x 2 and has Supervision of other normal pregnancy, antepartum on their problem list.. Patient does intend to breast feed. Pregnancy history fully reviewed.  Patient reports no complaints.  HISTORY: OB History  Gravida Para Term Preterm AB Living  3 2 2  0 0 2  SAB TAB Ectopic Multiple Live Births  0 0 0 0 1    # Outcome Date GA Lbr Len/2nd Weight Sex Delivery Anes PTL Lv  3 Current           2 Term 02/23/12 [redacted]w[redacted]d 02:55 / 00:03 5 lb 14.9 oz (2.69 kg) F Vag-Spont Local  LIV     Name: Latasha Kramer     Apgar1: 8  Apgar5: 9  1 Term 04/25/09     Vag-Spont EPI     History reviewed. No pertinent past medical history. History reviewed. No pertinent surgical history. Family History  Problem Relation Age of Onset  . Healthy Mother   . Healthy Father   . Healthy Sister   . Healthy Brother    Social History   Tobacco Use  . Smoking status: Never Smoker  . Smokeless tobacco: Never Used  Substance Use Topics  . Alcohol use: No  . Drug use: No   No Known Allergies Current Outpatient Medications on File Prior to Visit  Medication Sig Dispense Refill  . cephALEXin (KEFLEX) 500 MG capsule Take 500 mg by mouth 4 (four) times daily.    . phenazopyridine (PYRIDIUM) 200 MG tablet Take 1 tablet (200 mg total) by mouth 3 (three) times daily as needed for pain. (Patient not taking: Reported on 04/08/2019) 6 tablet 0  . potassium chloride SA (K-DUR,KLOR-CON) 20 MEQ tablet Take 1 tablet (20 mEq total) by mouth daily. (Patient not taking: Reported on 04/08/2019) 3 tablet 0   No current facility-administered medications on file prior to visit.     Indications for ASA therapy (per uptodate) One of the following: Previous pregnancy with preeclampsia, especially early onset and with an adverse outcome No Multifetal gestation  No Chronic hypertension No Type 1 or 2 diabetes mellitus No Chronic kidney disease No Autoimmune disease (antiphospholipid syndrome, systemic lupus erythematosus) No   Two or more of the following: Nulliparity No Obesity (body mass index >30 kg/m2) No Family history of preeclampsia in mother or sister No Age ?35 years No Sociodemographic characteristics (African American race, low socioeconomic level) No Personal risk factors (eg, previous pregnancy with low birth weight or small for gestational age infant, previous adverse pregnancy outcome [eg, stillbirth], interval >10 years between pregnancies) No   Indications for early 1 hour GTT (per uptodate)  BMI >25 (>23 in Asian women) : No (Pregravid BMI is 20.23)  Exam   Vitals:   04/08/19 1021  BP: 109/69  Pulse: 89  Temp: 97.9 F (36.6 C)  Weight: 113 lb 11.2 oz (51.6 kg)      Uterus:     Pelvic Exam: Perineum: no hemorrhoids, normal perineum   Vulva: normal external genitalia, no lesions   Vagina:  normal mucosa, normal discharge   Cervix: no lesions and normal, pap smear done.    Adnexa: normal adnexa and no mass, fullness, tenderness   Bony Pelvis: average  System: General: well-developed, well-nourished female in no acute distress   Breast:  normal appearance, no  masses or tenderness   Skin: normal coloration and turgor, no rashes   Neurologic: oriented, normal, negative, normal mood   Extremities: normal strength, tone, and muscle mass, ROM of all joints is normal   HEENT PERRLA, extraocular movement intact and sclera clear, anicteric   Mouth/Teeth mucous membranes moist, pharynx normal without lesions and dental hygiene good   Neck supple and no masses   Cardiovascular: regular rate and rhythm   Respiratory:  no respiratory distress, normal breath sounds   Abdomen: soft, non-tender; bowel sounds normal; no masses,  no organomegaly     Assessment:   Pregnancy: [id number] Patient Active Problem List   Diagnosis  Date Noted  . Supervision of other normal pregnancy, antepartum 04/07/2019     Plan:  1. Supervision of other normal pregnancy, antepartum --Anticipatory guidance about next visits/weeks of pregnancy given. --Next visit in 4 weeks virtual  - Enroll Patient in Babyscripts - Babyscripts Schedule Optimization - Genetic Screening - Cervicovaginal ancillary only( [place]) - Obstetric Panel, Including HIV - Culture, OB Urine - [place] MFM OB COMP + 14 WK; Future - AFP, Serum, Open Spina Bifida  Initial labs drawn. Continue prenatal vitamins. Discussed and offered genetic screening options, including Quad screen/AFP, NIPS testing, and option to decline testing. Benefits/risks/alternatives reviewed. Pt aware that anatomy US is form of genetic screening with lower accuracy in detecting trisomies than blood work.  Pt chooses genetic screening today. NIPS: requested. Ultrasound discussed; fetal anatomic survey: ordered. Problem list reviewed and updated. The nature of [facility] [facility] with multiple MDs and other Advanced Practice Providers was explained to patient; also emphasized that residents, students are part of our team. Routine obstetric precautions reviewed. Return in about 4 weeks (around 05/06/2019).   Sharen Counter, CNM 04/08/19 11:27 AM

## 2019-04-08 NOTE — Addendum Note (Signed)
Addended by: Maretta Bees on: 04/08/2019 11:41 AM   Modules accepted: Orders

## 2019-04-08 NOTE — Patient Instructions (Signed)

## 2019-04-08 NOTE — Addendum Note (Signed)
Addended by: Sharen Counter A on: 04/08/2019 12:45 PM   Modules accepted: Orders

## 2019-04-09 LAB — CERVICOVAGINAL ANCILLARY ONLY
Bacterial Vaginitis (gardnerella): NEGATIVE
Candida Glabrata: NEGATIVE
Candida Vaginitis: NEGATIVE
Chlamydia: NEGATIVE
Comment: NEGATIVE
Comment: NEGATIVE
Comment: NEGATIVE
Comment: NEGATIVE
Comment: NEGATIVE
Comment: NORMAL
Neisseria Gonorrhea: NEGATIVE
Trichomonas: NEGATIVE

## 2019-04-09 LAB — CYTOLOGY - PAP
Comment: NEGATIVE
Diagnosis: NEGATIVE
High risk HPV: NEGATIVE

## 2019-04-10 LAB — AFP, SERUM, OPEN SPINA BIFIDA
AFP MoM: 0.78
AFP Value: 38.1 ng/mL
Gest. Age on Collection Date: 17.4 weeks
Maternal Age At EDD: 27.4 yr
OSBR Risk 1 IN: 10000
Test Results:: NEGATIVE
Weight: 113 [lb_av]

## 2019-04-10 LAB — OBSTETRIC PANEL, INCLUDING HIV
Antibody Screen: NEGATIVE
Basophils Absolute: 0 10*3/uL (ref 0.0–0.2)
Basos: 0 %
EOS (ABSOLUTE): 0.1 10*3/uL (ref 0.0–0.4)
Eos: 1 %
HIV Screen 4th Generation wRfx: NONREACTIVE
Hematocrit: 37.2 % (ref 34.0–46.6)
Hemoglobin: 12.6 g/dL (ref 11.1–15.9)
Hepatitis B Surface Ag: NEGATIVE
Immature Grans (Abs): 0 10*3/uL (ref 0.0–0.1)
Immature Granulocytes: 1 %
Lymphocytes Absolute: 1.6 10*3/uL (ref 0.7–3.1)
Lymphs: 19 %
MCH: 31.4 pg (ref 26.6–33.0)
MCHC: 33.9 g/dL (ref 31.5–35.7)
MCV: 93 fL (ref 79–97)
Monocytes Absolute: 0.6 10*3/uL (ref 0.1–0.9)
Monocytes: 7 %
Neutrophils Absolute: 6.4 10*3/uL (ref 1.4–7.0)
Neutrophils: 72 %
Platelets: 164 10*3/uL (ref 150–450)
RBC: 4.01 x10E6/uL (ref 3.77–5.28)
RDW: 13 % (ref 11.7–15.4)
RPR Ser Ql: NONREACTIVE
Rh Factor: POSITIVE
Rubella Antibodies, IGG: 1.95 index (ref >0.99)
WBC: 8.7 10*3/uL (ref 3.4–10.8)

## 2019-04-10 LAB — URINE CULTURE, OB REFLEX

## 2019-04-10 LAB — CULTURE, OB URINE

## 2019-04-15 ENCOUNTER — Encounter: Payer: Self-pay | Admitting: Advanced Practice Midwife

## 2019-04-19 ENCOUNTER — Encounter: Payer: Self-pay | Admitting: Advanced Practice Midwife

## 2019-04-29 ENCOUNTER — Ambulatory Visit (HOSPITAL_COMMUNITY): Payer: BLUE CROSS/BLUE SHIELD

## 2019-05-05 ENCOUNTER — Telehealth (INDEPENDENT_AMBULATORY_CARE_PROVIDER_SITE_OTHER): Payer: BLUE CROSS/BLUE SHIELD | Admitting: Obstetrics

## 2019-05-05 ENCOUNTER — Encounter: Payer: Self-pay | Admitting: Obstetrics

## 2019-05-05 ENCOUNTER — Encounter: Payer: BLUE CROSS/BLUE SHIELD | Admitting: Obstetrics and Gynecology

## 2019-05-05 VITALS — BP 108/77 | HR 86

## 2019-05-05 DIAGNOSIS — Z3480 Encounter for supervision of other normal pregnancy, unspecified trimester: Secondary | ICD-10-CM

## 2019-05-05 DIAGNOSIS — Z3482 Encounter for supervision of other normal pregnancy, second trimester: Secondary | ICD-10-CM

## 2019-05-05 DIAGNOSIS — Z3A21 21 weeks gestation of pregnancy: Secondary | ICD-10-CM

## 2019-05-05 NOTE — Progress Notes (Signed)
   OBSTETRICS PRENATAL VIRTUAL VISIT ENCOUNTER NOTE  Provider location: Center for [facility] [place]   I connected with Latasha Kramer on 05/05/19 at  9:15 AM EDT by MyChart Video Encounter at home and verified that I am speaking with the correct person using two identifiers.   I discussed the limitations, risks, security and privacy concerns of performing an evaluation and management service virtually and the availability of in person appointments. I also discussed with the patient that there may be a patient responsible charge related to this service. The patient expressed understanding and agreed to proceed. Subjective:  Latasha Kramer is a 29 y.o. G3P2002 at [redacted]w[redacted]d being seen today for ongoing prenatal care.  She is currently monitored for the following issues for this low-risk pregnancy and has Supervision of other normal pregnancy, antepartum on their problem list.  Patient reports no complaints.  Contractions: Not present. Vag. Bleeding: None.  Movement: Present. Denies any leaking of fluid.   The following portions of the patient's history were reviewed and updated as appropriate: allergies, current medications, past family history, past medical history, past social history, past surgical history and problem list.   Objective:   Vitals:   05/05/19 0944  BP: 108/77  Pulse: 86    Fetal Status:     Movement: Present     General:  Alert, oriented and cooperative. Patient is in no acute distress.  Respiratory: Normal respiratory effort, no problems with respiration noted  Mental Status: Normal mood and affect. Normal behavior. Normal judgment and thought content.  Rest of physical exam deferred due to type of encounter  Imaging: No results found.  Assessment and Plan:  Pregnancy: G3P2002 at [redacted]w[redacted]d 1. Supervision of other normal pregnancy, antepartum   Preterm labor symptoms and general obstetric precautions including but not limited to vaginal bleeding, contractions, leaking of  fluid and fetal movement were reviewed in detail with the patient. I discussed the assessment and treatment plan with the patient. The patient was provided an opportunity to ask questions and all were answered. The patient agreed with the plan and demonstrated an understanding of the instructions. The patient was advised to call back or seek an in-person office evaluation/go to MAU at [facility] for any urgent or concerning symptoms. Please refer to After Visit Summary for other counseling recommendations.   I provided 10 minutes of face-to-face time during this encounter.  Return in about 4 weeks (around 06/02/2019) for MyChart.  Future Appointments  Date Time Provider Department Center  05/19/2019  8:15 AM WH-MFC [place] 4 WH-MFCUS MFC-US    Coral Ceo, MD Center for [facility], [facility] 05/05/2019

## 2019-05-05 NOTE — Progress Notes (Signed)
Pt is on the phone preparing for virtual visit with provider, [redacted]w[redacted]d.  

## 2019-05-19 ENCOUNTER — Ambulatory Visit (HOSPITAL_COMMUNITY)
Admission: RE | Admit: 2019-05-19 | Discharge: 2019-05-19 | Disposition: A | Discharge status: Home / Self Care | Payer: BLUE CROSS/BLUE SHIELD | Source: Ambulatory Visit | Attending: Obstetrics and Gynecology | Admitting: Obstetrics and Gynecology

## 2019-05-19 ENCOUNTER — Other Ambulatory Visit (HOSPITAL_COMMUNITY): Payer: Self-pay | Admitting: *Deleted

## 2019-05-19 ENCOUNTER — Other Ambulatory Visit: Payer: Self-pay

## 2019-05-19 DIAGNOSIS — Z362 Encounter for other antenatal screening follow-up: Secondary | ICD-10-CM

## 2019-05-19 DIAGNOSIS — Z363 Encounter for antenatal screening for malformations: Secondary | ICD-10-CM

## 2019-05-19 DIAGNOSIS — Z3A22 22 weeks gestation of pregnancy: Secondary | ICD-10-CM

## 2019-05-19 DIAGNOSIS — Z3687 Encounter for antenatal screening for uncertain dates: Secondary | ICD-10-CM

## 2019-05-19 DIAGNOSIS — Z3480 Encounter for supervision of other normal pregnancy, unspecified trimester: Secondary | ICD-10-CM | POA: Insufficient documentation

## 2019-05-21 ENCOUNTER — Encounter: Payer: Self-pay | Admitting: Advanced Practice Midwife

## 2019-05-21 DIAGNOSIS — IMO0002 Reserved for concepts with insufficient information to code with codable children: Secondary | ICD-10-CM | POA: Insufficient documentation

## 2019-05-21 DIAGNOSIS — O358XX0 Maternal care for other (suspected) fetal abnormality and damage, not applicable or unspecified: Secondary | ICD-10-CM | POA: Insufficient documentation

## 2019-06-02 ENCOUNTER — Telehealth (INDEPENDENT_AMBULATORY_CARE_PROVIDER_SITE_OTHER): Payer: BLUE CROSS/BLUE SHIELD | Admitting: Family Medicine

## 2019-06-02 VITALS — BP 116/82

## 2019-06-02 DIAGNOSIS — O358XX0 Maternal care for other (suspected) fetal abnormality and damage, not applicable or unspecified: Secondary | ICD-10-CM

## 2019-06-02 DIAGNOSIS — IMO0001 Reserved for inherently not codable concepts without codable children: Secondary | ICD-10-CM

## 2019-06-02 DIAGNOSIS — Z3480 Encounter for supervision of other normal pregnancy, unspecified trimester: Secondary | ICD-10-CM

## 2019-06-02 NOTE — Patient Instructions (Signed)

## 2019-06-02 NOTE — Progress Notes (Signed)
   TELEHEALTH VIRTUAL OBSTETRICS VISIT ENCOUNTER NOTE  I connected with Latasha Kramer on 06/02/19 at  8:15 AM EDT by telephone at home and verified that I am speaking with the correct person using two identifiers.   I discussed the limitations, risks, security and privacy concerns of performing an evaluation and management service by telephone and the availability of in person appointments. I also discussed with the patient that there may be a patient responsible charge related to this service. The patient expressed understanding and agreed to proceed.  Subjective:  Latasha Kramer is a 29 y.o. G3P2002 at [redacted]w[redacted]d being followed for ongoing prenatal care.  She is currently monitored for the following issues for this low-risk pregnancy and has Supervision of other normal pregnancy, antepartum and Fetal cardiac echogenic focus on their problem list.  Patient reports no complaints. Reports fetal movement. Denies any contractions, bleeding or leaking of fluid.   The following portions of the patient's history were reviewed and updated as appropriate: allergies, current medications, past family history, past medical history, past social history, past surgical history and problem list.   Objective:   General:  Alert, oriented and cooperative.   Mental Status: Normal mood and affect perceived. Normal judgment and thought content.  Rest of physical exam deferred due to type of encounter  Assessment and Plan:  Pregnancy: G3P2002 at [redacted]w[redacted]d 1. Supervision of other normal pregnancy, antepartum - continue routine prenatal care  2. Fetal cardiac echogenic focus, single or unspecified fetus - low risk by cell free DNA - Follow up [place] 5/20  3. Birth control counselling - Patient has been counseled on birth control options today. We discussed risks and benefits of each available contraception, including but not limited to: BTL, IUD, Nexplanon, Depo Shot, Nuvaring, OCPs, and condoms/diaphragms.  - Thinking that this  will be her last baby, would like to consider options and discuss more at her next visit   Preterm labor symptoms and general obstetric precautions including but not limited to vaginal bleeding, contractions, leaking of fluid and fetal movement were reviewed in detail with the patient.  I discussed the assessment and treatment plan with the patient. The patient was provided an opportunity to ask questions and all were answered. The patient agreed with the plan and demonstrated an understanding of the instructions. The patient was advised to call back or seek an in-person office evaluation/go to MAU at [facility] for any urgent or concerning symptoms. Please refer to After Visit Summary for other counseling recommendations.   I provided 9 minutes of non-face-to-face time during this encounter.  Return in about 2 weeks (around 06/16/2019) for in person, 28 week labs and 2 hour GTT.  Future Appointments  Date Time Provider Department Center  06/17/2019  9:45 AM [facility] NURSE [facility] - Closed  06/17/2019  9:45 AM [facility] US5 [facility]    Hayze Gazda Theresia Lo, DO Center for [employer], [facility]

## 2019-06-16 ENCOUNTER — Other Ambulatory Visit: Payer: Self-pay

## 2019-06-16 ENCOUNTER — Ambulatory Visit (INDEPENDENT_AMBULATORY_CARE_PROVIDER_SITE_OTHER): Payer: BLUE CROSS/BLUE SHIELD | Admitting: Women's Health

## 2019-06-16 ENCOUNTER — Other Ambulatory Visit: Payer: BLUE CROSS/BLUE SHIELD

## 2019-06-16 VITALS — BP 102/69 | HR 83 | Wt 123.0 lb

## 2019-06-16 DIAGNOSIS — Z3480 Encounter for supervision of other normal pregnancy, unspecified trimester: Secondary | ICD-10-CM

## 2019-06-16 DIAGNOSIS — O358XX0 Maternal care for other (suspected) fetal abnormality and damage, not applicable or unspecified: Secondary | ICD-10-CM

## 2019-06-16 DIAGNOSIS — IMO0001 Reserved for inherently not codable concepts without codable children: Secondary | ICD-10-CM

## 2019-06-16 NOTE — Patient Instructions (Addendum)
Maternity Assessment Unit (MAU)  The Maternity Assessment Unit (MAU) is located at the [facility] and [place] at [facility]. The address is: [address] [place], [place], [place] [address]. Please see map below for additional directions.    The Maternity Assessment Unit is designed to help you during your pregnancy, and for up to 6 weeks after delivery, with any pregnancy- or postpartum-related emergencies, if you think you are in labor, or if your water has broken. For example, if you experience nausea and vomiting, vaginal bleeding, severe abdominal or pelvic pain, elevated blood pressure or other problems related to your pregnancy or postpartum time, please come to the Maternity Assessment Unit for assistance.        Preterm Labor and Birth Information  The normal length of a pregnancy is 39-41 weeks. Preterm labor is when labor starts before 37 completed weeks of pregnancy. What are the risk factors for preterm labor? Preterm labor is more likely to occur in women who:  Have certain infections during pregnancy such as a bladder infection, sexually transmitted infection, or infection inside the uterus (chorioamnionitis).  Have a shorter-than-normal cervix.  Have gone into preterm labor before.  Have had surgery on their cervix.  Are younger than age 64 or older than age 26.  Are African American.  Are pregnant with twins or multiple babies (multiple gestation).  Take street drugs or smoke while pregnant.  Do not gain enough weight while pregnant.  Became pregnant shortly after having been pregnant. What are the symptoms of preterm labor? Symptoms of preterm labor include:  Cramps similar to those that can happen during a menstrual period. The cramps may happen with diarrhea.  Pain in the abdomen or lower back.  Regular uterine contractions that may feel like tightening of the abdomen.  A feeling of increased pressure in the  pelvis.  Increased watery or bloody mucus discharge from the vagina.  Water breaking (ruptured amniotic sac). Why is it important to recognize signs of preterm labor? It is important to recognize signs of preterm labor because babies who are born prematurely may not be fully developed. This can put them at an increased risk for:  Long-term (chronic) heart and lung problems.  Difficulty immediately after birth with regulating body systems, including blood sugar, body temperature, heart rate, and breathing rate.  Bleeding in the brain.  Cerebral palsy.  Learning difficulties.  Death. These risks are highest for babies who are born before 38 weeks of pregnancy. How is preterm labor treated? Treatment depends on the length of your pregnancy, your condition, and the health of your baby. It may involve:  Having a stitch (suture) placed in your cervix to prevent your cervix from opening too early (cerclage).  Taking or being given medicines, such as: ? Hormone medicines. These may be given early in pregnancy to help support the pregnancy. ? Medicine to stop contractions. ? Medicines to help mature the baby's lungs. These may be prescribed if the risk of delivery is high. ? Medicines to prevent your baby from developing cerebral palsy. If the labor happens before 34 weeks of pregnancy, you may need to stay in the hospital. What should I do if I think I am in preterm labor? If you think that you are going into preterm labor, call your health care provider right away. How can I prevent preterm labor in future pregnancies? To increase your chance of having a full-term pregnancy:  Do not use any tobacco products, such as  cigarettes, chewing tobacco, and e-cigarettes. If you need help quitting, ask your health care provider.  Do not use street drugs or medicines that have not been prescribed to you during your pregnancy.  Talk with your health care provider before taking any herbal  supplements, even if you have been taking them regularly.  Make sure you gain a healthy amount of weight during your pregnancy.  Watch for infection. If you think that you might have an infection, get it checked right away.  Make sure to tell your health care provider if you have gone into preterm labor before. This information is not intended to replace advice given to you by your health care provider. Make sure you discuss any questions you have with your health care provider. Document Revised: 05/08/2018 Document Reviewed: 06/07/2015 Elsevier Patient Education  [place].         Glucose Tolerance Test During Pregnancy Why am I having this test? The glucose tolerance test (GTT) is done to check how your body processes sugar (glucose). This is one of several tests used to diagnose diabetes that develops during pregnancy (gestational diabetes mellitus). Gestational diabetes is a temporary form of diabetes that some women develop during pregnancy. It usually occurs during the second trimester of pregnancy and goes away after delivery. Testing (screening) for gestational diabetes usually occurs between 24 and 28 weeks of pregnancy. You may have the GTT test after having a 1-hour glucose screening test if the results from that test indicate that you may have gestational diabetes. You may also have this test if:  You have a history of gestational diabetes.  You have a history of giving birth to very large babies or have experienced repeated fetal loss (stillbirth).  You have signs and symptoms of diabetes, such as: ? Changes in your vision. ? Tingling or numbness in your hands or feet. ? Changes in hunger, thirst, and urination that are not otherwise explained by your pregnancy. What is being tested? This test measures the amount of glucose in your blood at different times during a period of 3 hours. This indicates how well your body is able to process glucose. What kind of  sample is taken?  Blood samples are required for this test. They are usually collected by inserting a needle into a blood vessel. How do I prepare for this test?  For 3 days before your test, eat normally. Have plenty of carbohydrate-rich foods.  Follow instructions from your health care provider about: ? Eating or drinking restrictions on the day of the test. You may be asked to not eat or drink anything other than water (fast) starting 8-10 hours before the test. ? Changing or stopping your regular medicines. Some medicines may interfere with this test. Tell a health care provider about:  All medicines you are taking, including vitamins, herbs, eye drops, creams, and over-the-counter medicines.  Any blood disorders you have.  Any surgeries you have had.  Any medical conditions you have. What happens during the test? First, your blood glucose will be measured. This is referred to as your fasting blood glucose, since you fasted before the test. Then, you will drink a glucose solution that contains a certain amount of glucose. Your blood glucose will be measured again 1, 2, and 3 hours after drinking the solution. This test takes about 3 hours to complete. You will need to stay at the testing location during this time. During the testing period:  Do not eat or drink anything other  than the glucose solution.  Do not exercise.  Do not use any products that contain nicotine or tobacco, such as cigarettes and e-cigarettes. If you need help stopping, ask your health care provider. The testing procedure may vary among health care providers and hospitals. How are the results reported? Your results will be reported as milligrams of glucose per deciliter of blood (mg/dL) or millimoles per liter (mmol/L). Your health care provider will compare your results to normal ranges that were established after testing a large group of people (reference ranges). Reference ranges may vary among labs and  hospitals. For this test, common reference ranges are:  Fasting: less than 95-105 mg/dL (1.9-1.45.3-5.8 mmol/L).  1 hour after drinking glucose: less than 180-190 mg/dL (78.2-95.610.0-10.5 mmol/L).  2 hours after drinking glucose: less than 155-165 mg/dL (2.1-3.08.6-9.2 mmol/L).  3 hours after drinking glucose: 140-145 mg/dL (8.6-5.77.8-8.1 mmol/L). What do the results mean? Results within reference ranges are considered normal, meaning that your glucose levels are well-controlled. If two or more of your blood glucose levels are high, you may be diagnosed with gestational diabetes. If only one level is high, your health care provider may suggest repeat testing or other tests to confirm a diagnosis. Talk with your health care provider about what your results mean. Questions to ask your health care provider Ask your health care provider, or the department that is doing the test:  When will my results be ready?  How will I get my results?  What are my treatment options?  What other tests do I need?  What are my next steps? Summary  The glucose tolerance test (GTT) is one of several tests used to diagnose diabetes that develops during pregnancy (gestational diabetes mellitus). Gestational diabetes is a temporary form of diabetes that some women develop during pregnancy.  You may have the GTT test after having a 1-hour glucose screening test if the results from that test indicate that you may have gestational diabetes. You may also have this test if you have any symptoms or risk factors for gestational diabetes.  Talk with your health care provider about what your results mean. This information is not intended to replace advice given to you by your health care provider. Make sure you discuss any questions you have with your health care provider. Document Revised: 05/07/2018 Document Reviewed: 08/26/2016 Elsevier Patient Education  2020 [employer]       https://www.cdc.gov/vaccines/hcp/vis/vis-statements/tdap.pdf">  Tdap (Tetanus, Diphtheria, Pertussis) Vaccine: What You Need to Know 1. Why get vaccinated? Tdap vaccine can prevent tetanus, diphtheria, and pertussis. Diphtheria and pertussis spread from person to person. Tetanus enters the body through cuts or wounds.  TETANUS (T) causes painful stiffening of the muscles. Tetanus can lead to serious health problems, including being unable to open the mouth, having trouble swallowing and breathing, or death.  DIPHTHERIA (D) can lead to difficulty breathing, heart failure, paralysis, or death.  PERTUSSIS (aP), also known as "whooping cough," can cause uncontrollable, violent coughing which makes it hard to breathe, eat, or drink. Pertussis can be extremely serious in babies and young children, causing pneumonia, convulsions, brain damage, or death. In teens and adults, it can cause weight loss, loss of bladder control, passing out, and rib fractures from severe coughing. 2. Tdap vaccine Tdap is only for children 7 years and older, adolescents, and adults.  Adolescents should receive a single dose of Tdap, preferably at age 27 or 12 years. Pregnant women should get a dose of Tdap during every pregnancy, to  protect the newborn from pertussis. Infants are most at risk for severe, life-threatening complications from pertussis. Adults who have never received Tdap should get a dose of Tdap. Also, adults should receive a booster dose every 10 years, or earlier in the case of a severe and dirty wound or burn. Booster doses can be either Tdap or Td (a different vaccine that protects against tetanus and diphtheria but not pertussis). Tdap may be given at the same time as other vaccines. 3. Talk with your health care provider Tell your vaccine provider if the person getting the vaccine:  Has had an allergic reaction after a previous dose of any vaccine that protects against tetanus,  diphtheria, or pertussis, or has any severe, life-threatening allergies.  Has had a coma, decreased level of consciousness, or prolonged seizures within 7 days after a previous dose of any pertussis vaccine (DTP, DTaP, or Tdap).  Has seizures or another nervous system problem.  Has ever had Guillain-Barr Syndrome (also called GBS).  Has had severe pain or swelling after a previous dose of any vaccine that protects against tetanus or diphtheria. In some cases, your health care provider may decide to postpone Tdap vaccination to a future visit.  People with minor illnesses, such as a cold, may be vaccinated. People who are moderately or severely ill should usually wait until they recover before getting Tdap vaccine.  Your health care provider can give you more information. 4. Risks of a vaccine reaction  Pain, redness, or swelling where the shot was given, mild fever, headache, feeling tired, and nausea, vomiting, diarrhea, or stomachache sometimes happen after Tdap vaccine. People sometimes faint after medical procedures, including vaccination. Tell your provider if you feel dizzy or have vision changes or ringing in the ears.  As with any medicine, there is a very remote chance of a vaccine causing a severe allergic reaction, other serious injury, or death. 5. What if there is a serious problem? An allergic reaction could occur after the vaccinated person leaves the clinic. If you see signs of a severe allergic reaction (hives, swelling of the face and throat, difficulty breathing, a fast heartbeat, dizziness, or weakness), call 9-1-1 and get the person to the nearest hospital. For other signs that concern you, call your health care provider.  Adverse reactions should be reported to the Vaccine Adverse Event Reporting System (VAERS). Your health care provider will usually file this report, or you can do it yourself. Visit the VAERS website at www.vaers.LAgents.no or call 816-732-7392. VAERS is  only for reporting reactions, and VAERS staff do not give medical advice. 6. The National Vaccine Injury Compensation Program The [employer] Vaccine Injury Compensation Program (VICP) is a federal program that was created to compensate people who may have been injured by certain vaccines. Visit the VICP website at SpiritualWord.at or call (914)794-8813 to learn about the program and about filing a claim. There is a time limit to file a claim for compensation. 7. How can I learn more?  Ask your health care provider.  Call your local or state health department.  Contact the Centers for Disease Control and Prevention (CDC): ? Call 904-803-3544 (1-800-CDC-INFO) or ? Visit CDC's website at PicCapture.uy Vaccine Information Statement Tdap (Tetanus, Diphtheria, Pertussis) Vaccine (04/29/2018) This information is not intended to replace advice given to you by your health care provider. Make sure you discuss any questions you have with your health care provider. Document Revised: 05/08/2018 Document Reviewed: 05/11/2018 Elsevier Patient Education  2020 [employer].  Contraception Choices - [email or web address].[facility] Contraception, also called birth control, refers to methods or devices that prevent pregnancy. Hormonal methods Contraceptive implant  A contraceptive implant is a thin, plastic tube that contains a hormone. It is inserted into the upper part of the arm. It can remain in place for up to 3 years. Progestin-only injections Progestin-only injections are injections of progestin, a synthetic form of the hormone progesterone. They are given every 3 months by a health care provider. Birth control pills  Birth control pills are pills that contain hormones that prevent pregnancy. They must be taken once a day, preferably at the same time each day. Birth control patch  The birth control patch contains hormones that prevent pregnancy. It is placed on the skin and  must be changed once a week for three weeks and removed on the fourth week. A prescription is needed to use this method of contraception. Vaginal ring  A vaginal ring contains hormones that prevent pregnancy. It is placed in the vagina for three weeks and removed on the fourth week. After that, the process is repeated with a new ring. A prescription is needed to use this method of contraception. Emergency contraceptive Emergency contraceptives prevent pregnancy after unprotected sex. They come in pill form and can be taken up to 5 days after sex. They work best the sooner they are taken after having sex. Most emergency contraceptives are available without a prescription. This method should not be used as your only form of birth control. Barrier methods Female condom  A female condom is a thin sheath that is worn over the penis during sex. Condoms keep sperm from going inside a woman's body. They can be used with a spermicide to increase their effectiveness. They should be disposed after a single use. Female condom  A female condom is a soft, loose-fitting sheath that is put into the vagina before sex. The condom keeps sperm from going inside a woman's body. They should be disposed after a single use. Diaphragm  A diaphragm is a soft, dome-shaped barrier. It is inserted into the vagina before sex, along with a spermicide. The diaphragm blocks sperm from entering the uterus, and the spermicide kills sperm. A diaphragm should be left in the vagina for 6-8 hours after sex and removed within 24 hours. A diaphragm is prescribed and fitted by a health care provider. A diaphragm should be replaced every 1-2 years, after giving birth, after gaining more than 15 lb (6.8 kg), and after pelvic surgery. Cervical cap  A cervical cap is a round, soft latex or plastic cup that fits over the cervix. It is inserted into the vagina before sex, along with spermicide. It blocks sperm from entering the uterus. The cap  should be left in place for 6-8 hours after sex and removed within 48 hours. A cervical cap must be prescribed and fitted by a health care provider. It should be replaced every 2 years. Sponge  A sponge is a soft, circular piece of polyurethane foam with spermicide on it. The sponge helps block sperm from entering the uterus, and the spermicide kills sperm. To use it, you make it wet and then insert it into the vagina. It should be inserted before sex, left in for at least 6 hours after sex, and removed and thrown away within 30 hours. Spermicides Spermicides are chemicals that kill or block sperm from entering the cervix and uterus. They can come as a cream, jelly, suppository, foam, or tablet. A spermicide should be  inserted into the vagina with an applicator at least 10-15 minutes before sex to allow time for it to work. The process must be repeated every time you have sex. Spermicides do not require a prescription. Intrauterine contraception Intrauterine device (IUD) An IUD is a T-shaped device that is put in a woman's uterus. There are two types:  Hormone IUD.This type contains progestin, a synthetic form of the hormone progesterone. This type can stay in place for 3-5 years.  Copper IUD.This type is wrapped in copper wire. It can stay in place for 10 years.  Permanent methods of contraception Female tubal ligation In this method, a woman's fallopian tubes are sealed, tied, or blocked during surgery to prevent eggs from traveling to the uterus. Hysteroscopic sterilization In this method, a small, flexible insert is placed into each fallopian tube. The inserts cause scar tissue to form in the fallopian tubes and block them, so sperm cannot reach an egg. The procedure takes about 3 months to be effective. Another form of birth control must be used during those 3 months. Female sterilization This is a procedure to tie off the tubes that carry sperm (vasectomy). After the procedure, the man can  still ejaculate fluid (semen). Natural planning methods Natural family planning In this method, a couple does not have sex on days when the woman could become pregnant. Calendar method This means keeping track of the length of each menstrual cycle, identifying the days when pregnancy can happen, and not having sex on those days. Ovulation method In this method, a couple avoids sex during ovulation. Symptothermal method This method involves not having sex during ovulation. The woman typically checks for ovulation by watching changes in her temperature and in the consistency of cervical mucus. Post-ovulation method In this method, a couple waits to have sex until after ovulation. Summary  Contraception, also called birth control, means methods or devices that prevent pregnancy.  Hormonal methods of contraception include implants, injections, pills, patches, vaginal rings, and emergency contraceptives.  Barrier methods of contraception can include female condoms, female condoms, diaphragms, cervical caps, sponges, and spermicides.  There are two types of IUDs (intrauterine devices). An IUD can be put in a woman's uterus to prevent pregnancy for 3-5 years.  Permanent sterilization can be done through a procedure for males, females, or both.  Natural family planning methods involve not having sex on days when the woman could become pregnant. This information is not intended to replace advice given to you by your health care provider. Make sure you discuss any questions you have with your health care provider. Document Revised: 01/16/2017 Document Reviewed: 02/17/2016 Elsevier Patient Education  2020 [employer].

## 2019-06-16 NOTE — Progress Notes (Signed)
Subjective:  Latasha Kramer is a 29 y.o. G3P2002 at [redacted]w[redacted]d being seen today for ongoing prenatal care.  She is currently monitored for the following issues for this low-risk pregnancy and has Supervision of other normal pregnancy, antepartum and Fetal cardiac echogenic focus on their problem list.  Patient reports no complaints.  Contractions: Not present. Vag. Bleeding: None.  Movement: Present. Denies leaking of fluid.   The following portions of the patient's history were reviewed and updated as appropriate: allergies, current medications, past family history, past medical history, past social history, past surgical history and problem list. Problem list updated.  Objective:   Vitals:   06/16/19 0842  BP: 102/69  Pulse: 83  Weight: 123 lb (55.8 kg)    Fetal Status: Fetal Heart Rate (bpm): 145 Fundal Height: 27 cm Movement: Present     General:  Alert, oriented and cooperative. Patient is in no acute distress.  Skin: Skin is warm and dry. No rash noted.   Cardiovascular: Normal heart rate noted  Respiratory: Normal respiratory effort, no problems with respiration noted  Abdomen: Soft, gravid, appropriate for gestational age. Pain/Pressure: Absent     Pelvic: Vag. Bleeding: None     Cervical exam deferred        Extremities: Normal range of motion.     Mental Status: Normal mood and affect. Normal behavior. Normal judgment and thought content.   Urinalysis:      Assessment and Plan:  Pregnancy: G3P2002 at [redacted]w[redacted]d  1. Supervision of other normal pregnancy, antepartum -GTT/labs today -Tdap declined today, information given and discussed, will consider -discussed contraception, pt elects condoms or IUD, will consider ppartum IUD in hospital, pending insurance coverage  2. Fetal cardiac echogenic focus, single or unspecified fetus -normal NIPS, f/u US scheduled 06/17/2019, pt aware  Preterm labor symptoms and general obstetric precautions including but not limited to vaginal bleeding,  contractions, leaking of fluid and fetal movement were reviewed in detail with the patient. Please refer to After Visit Summary for other counseling recommendations.  Return in about 2 weeks (around 06/30/2019) for in-person LOB/APP OK.   Andreya Lacks, Odie Sera, NP

## 2019-06-17 ENCOUNTER — Ambulatory Visit: Payer: BLUE CROSS/BLUE SHIELD | Admitting: *Deleted

## 2019-06-17 ENCOUNTER — Ambulatory Visit (HOSPITAL_COMMUNITY): Payer: BLUE CROSS/BLUE SHIELD | Attending: Obstetrics and Gynecology

## 2019-06-17 DIAGNOSIS — Z3687 Encounter for antenatal screening for uncertain dates: Secondary | ICD-10-CM

## 2019-06-17 DIAGNOSIS — O358XX0 Maternal care for other (suspected) fetal abnormality and damage, not applicable or unspecified: Secondary | ICD-10-CM | POA: Insufficient documentation

## 2019-06-17 DIAGNOSIS — Z362 Encounter for other antenatal screening follow-up: Secondary | ICD-10-CM | POA: Insufficient documentation

## 2019-06-17 DIAGNOSIS — IMO0001 Reserved for inherently not codable concepts without codable children: Secondary | ICD-10-CM

## 2019-06-17 DIAGNOSIS — Z3A26 26 weeks gestation of pregnancy: Secondary | ICD-10-CM

## 2019-06-17 LAB — HIV ANTIBODY (ROUTINE TESTING W REFLEX): HIV Screen 4th Generation wRfx: NONREACTIVE

## 2019-06-17 LAB — CBC
Hematocrit: 35.2 % (ref 34.0–46.6)
Hemoglobin: 11.5 g/dL (ref 11.1–15.9)
MCH: 29.9 pg (ref 26.6–33.0)
MCHC: 32.7 g/dL (ref 31.5–35.7)
MCV: 92 fL (ref 79–97)
Platelets: 176 10*3/uL (ref 150–450)
RBC: 3.84 x10E6/uL (ref 3.77–5.28)
RDW: 12.3 % (ref 11.7–15.4)
WBC: 8.2 10*3/uL (ref 3.4–10.8)

## 2019-06-17 LAB — GLUCOSE TOLERANCE, 2 HOURS W/ 1HR
Glucose, 1 hour: 97 mg/dL (ref 65–179)
Glucose, 2 hour: 105 mg/dL (ref 65–152)
Glucose, Fasting: 75 mg/dL (ref 65–91)

## 2019-06-17 LAB — RPR: RPR Ser Ql: NONREACTIVE

## 2019-06-17 IMAGING — US US MFM OB FOLLOW-UP
1 series · 14 of 28 positions shown · non-contrast
Comparison: none

[Series 1: us mfm ob follow-up · 14 of 40 slices shown]
[im 2/40]
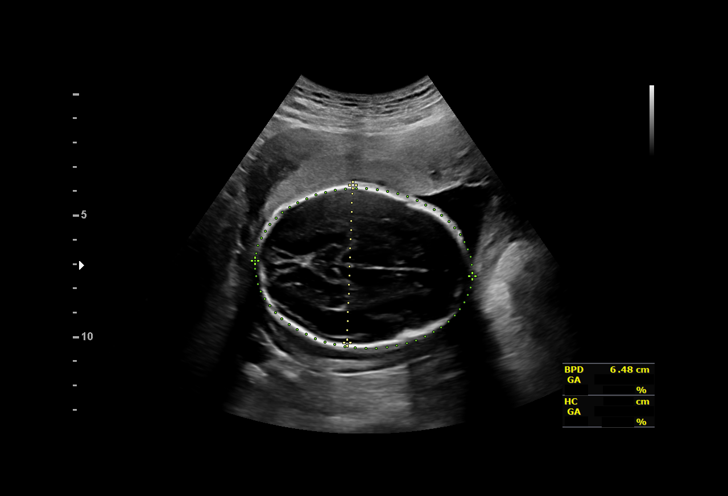
[im 5/40]
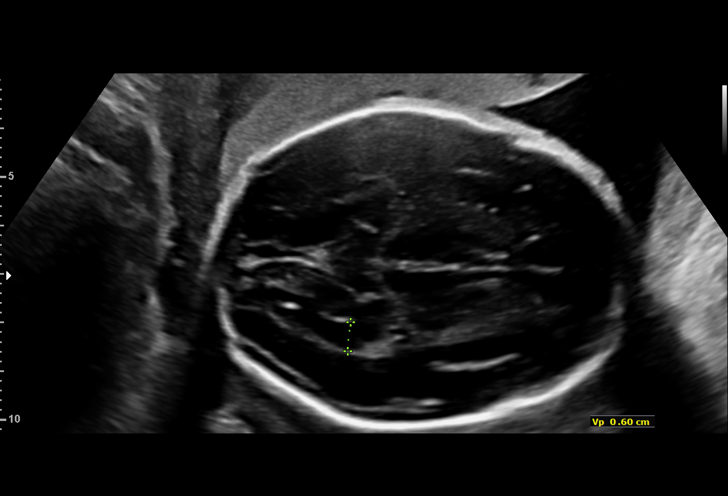
[im 8/40]
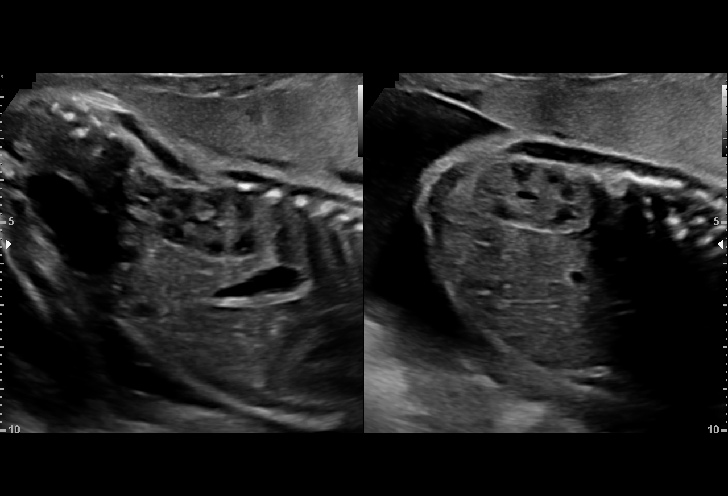
[im 11/40]
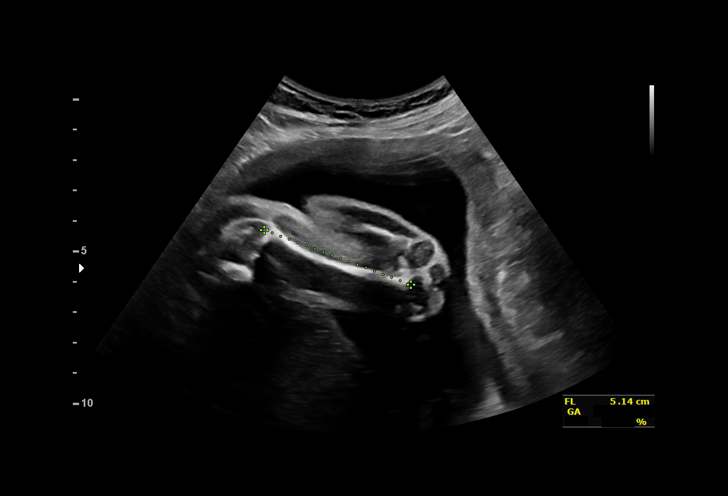
[im 14/40]
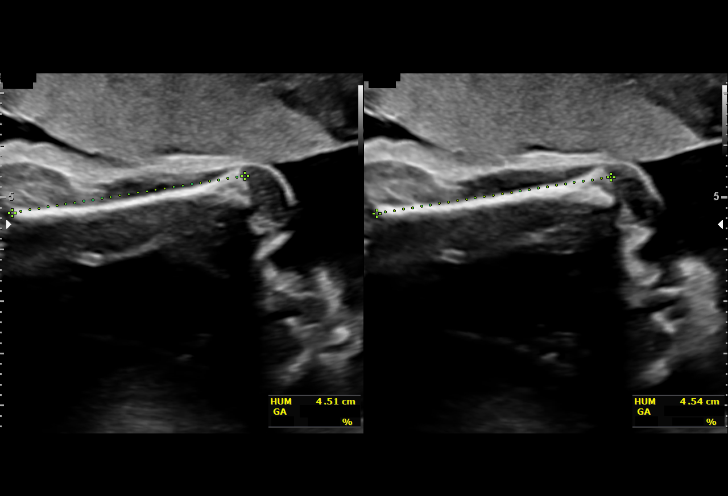
[im 16/40]
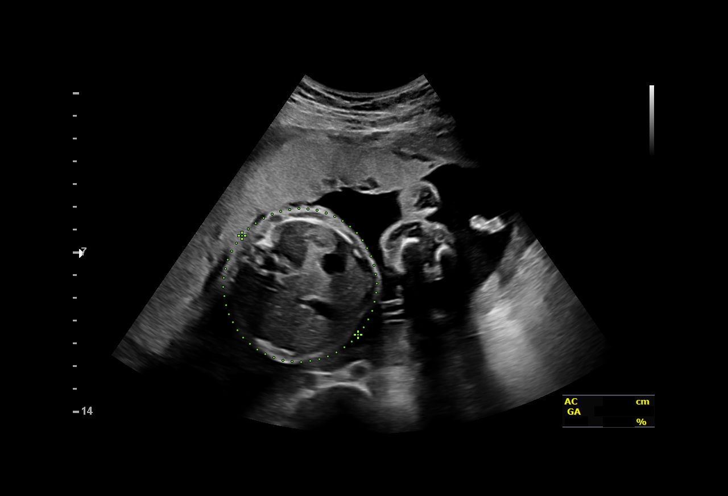
[im 19/40]
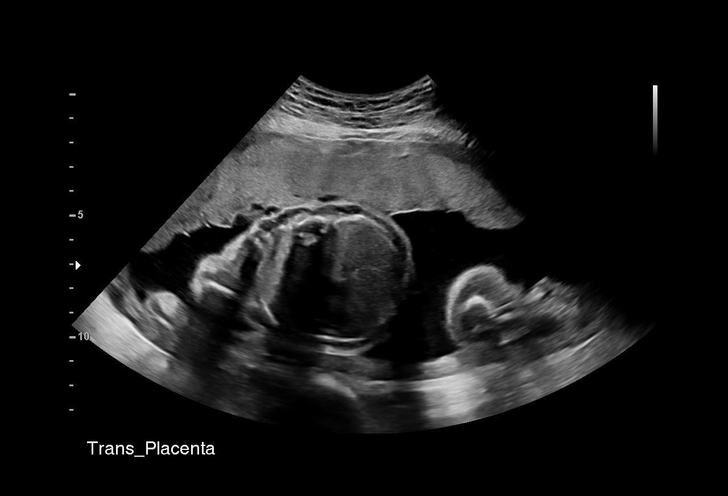
[im 22/40]
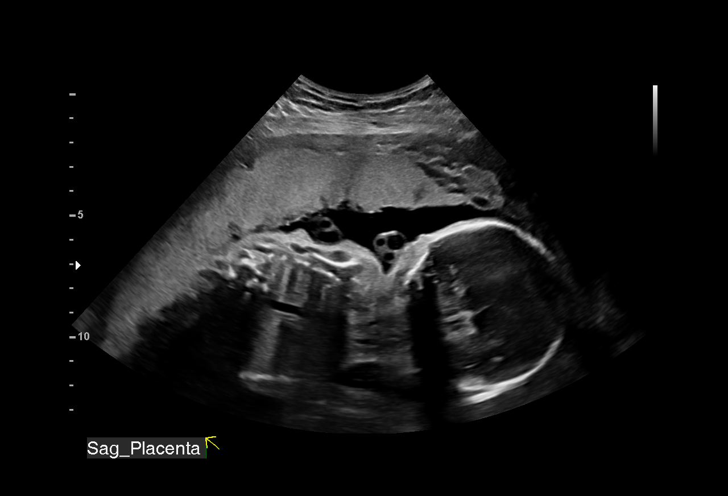
[im 25/40]
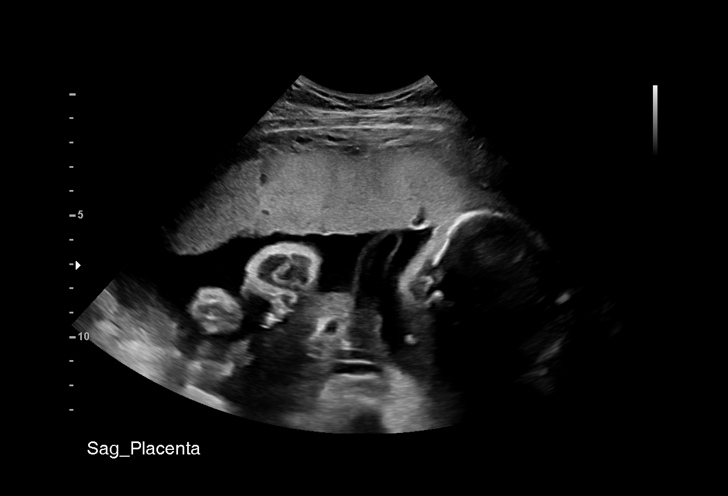
[im 28/40]
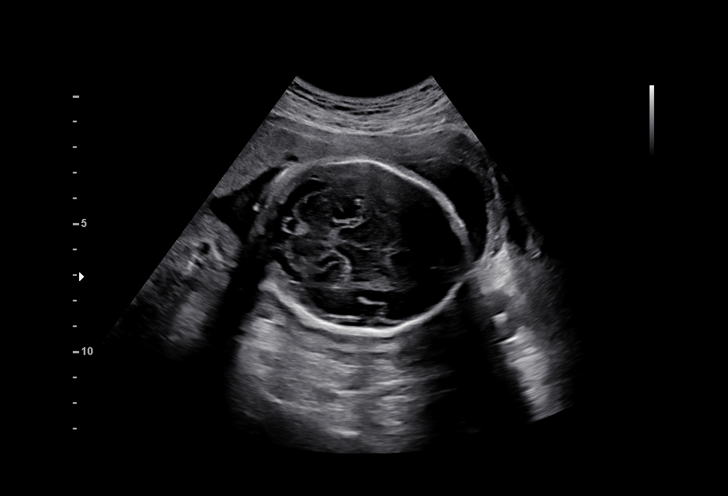
[im 31/40]
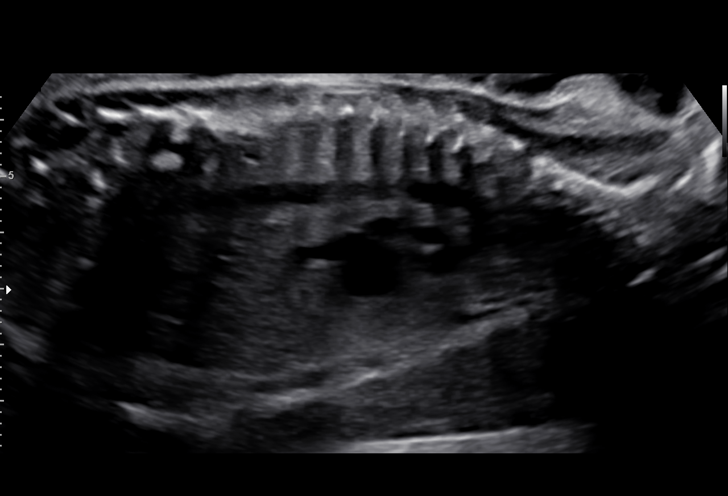
[im 34/40]
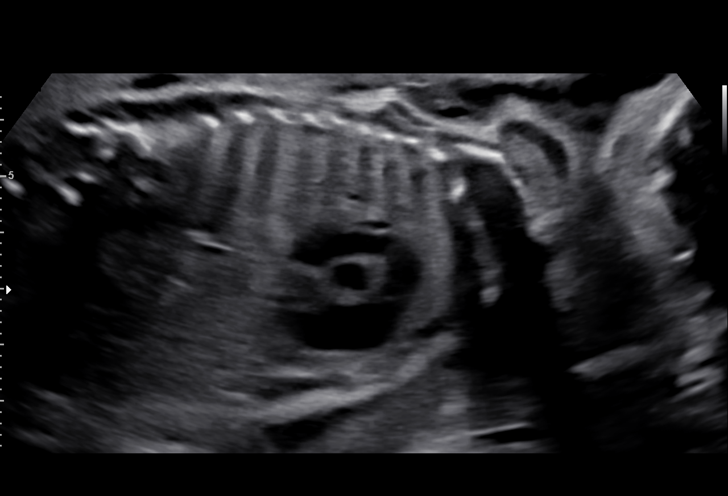
[im 37/40]
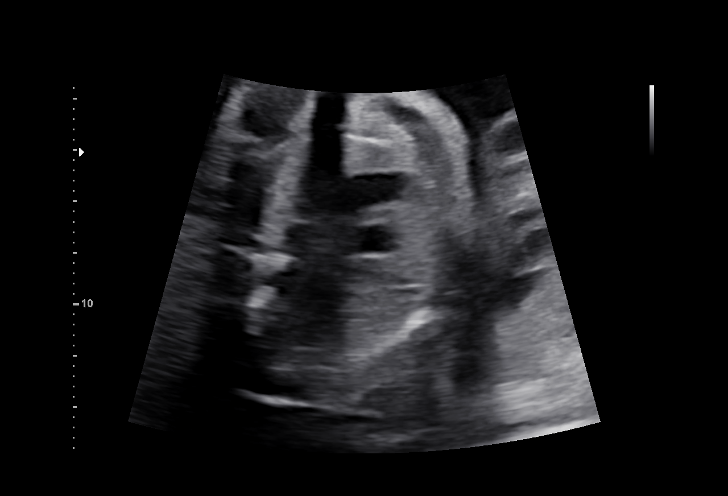
[im 40/40]
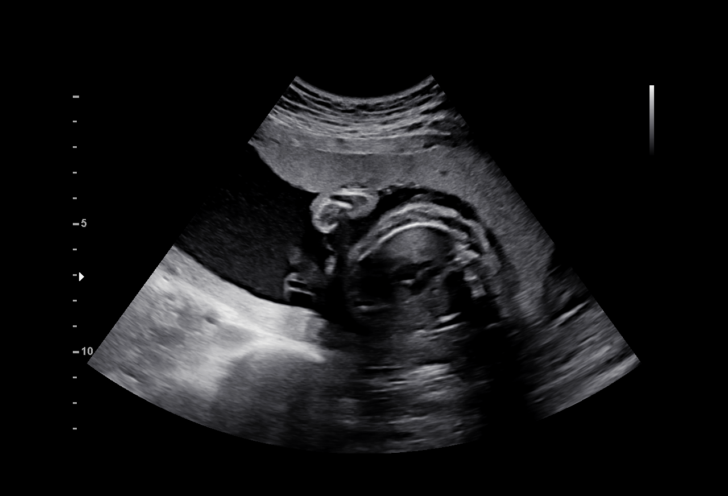

[14 of 28 positions shown; findings below may reference images not displayed]

Indications

 Encounter for uncertain dates                  [HO]
 Encounter for other antenatal screening        [HO]
 follow-up
 26 weeks gestation of pregnancy
 LR NIPS, Neg Horizon, Neg AFP
Fetal Evaluation

 Num Of Fetuses:         1
 Fetal Heart Rate(bpm):  146
 Cardiac Activity:       Observed
 Presentation:           Cephalic
 Placenta:               Anterior
 P. Cord Insertion:      Previously Visualized

 Amniotic Fluid
 AFI FV:      Within normal limits

                             Largest Pocket(cm)

Biometry

 BPD:      64.8  mm     G. Age:  26w 1d         26  %    CI:        70.96   %    70 - 86
                                                         FL/HC:      20.3   %    18.6 -
 HC:      245.1  mm     G. Age:  26w 4d         25  %    HC/AC:      1.15        1.05 -
 AC:      213.5  mm     G. Age:  25w 6d         21  %    FL/BPD:     76.7   %    71 - 87
 FL:       49.7  mm     G. Age:  26w 5d         41  %    FL/AC:      23.3   %    20 - 24
 HUM:      45.3  mm     G. Age:  26w 6d         52  %

 LV:          6  mm
 Est. FW:     915  gm           2 lb     27  %
OB History

 Gravidity:    3         Term:   2        Prem:   0        SAB:   0
 TOP:          0       Ectopic:  0        Living: 23
Gestational Age

 U/S Today:     26w 2d                                        EDD:   [DATE]
 Best:          26w 4d     Det. By:  U/S  ([DATE])          EDD:   [DATE]
Anatomy

 Cranium:               Appears normal         LVOT:                   Previously seen
 Cavum:                 Previously seen        Aortic Arch:            Appears normal
 Ventricles:            Appears normal         Ductal Arch:            Previously seen
 Choroid Plexus:        Previously seen        Diaphragm:              Previously seen
 Cerebellum:            Previously seen        Stomach:                Appears normal, left
                                                                       sided
 Posterior Fossa:       Previously seen        Abdomen:                Previously seen
 Nuchal Fold:           Not applicable (>20    Abdominal Wall:         Previously seen
                        wks GA)
 Face:                  Orbits and profile     Cord Vessels:           Previously seen
                        previously seen
 Lips:                  Previously seen        Kidneys:                Appear normal
 Palate:                Not well visualized    Bladder:                Appears normal
 Thoracic:              Appears normal         Spine:                  Previously seen
 Heart:                 Echogenic focus        Upper Extremities:      Previously seen
                        in LV prev seen
 RVOT:                  Previously seen        Lower Extremities:      Previously seen

 Other:  Fetus appears to be female prev seen. Heels prev visualized. Nasal
         bone prev visualized. Technically difficult due to fetal position.
Cervix Uterus Adnexa

 Cervix
 Not visualized (advanced GA >[HO])
Comments

 This patient was seen for a follow up exam to confirm her
 dates.  She denies any problems since her last exam.
 She was informed that the fetal growth and amniotic fluid
 level appears appropriate for her gestational age.  The fetal
 biometry measurements obtained today are consistent with
 an EDC [DATE].  This should be used as her final
 EDC.
 No further exams were scheduled in our office.

## 2019-06-29 ENCOUNTER — Other Ambulatory Visit: Payer: Self-pay

## 2019-06-29 ENCOUNTER — Ambulatory Visit (INDEPENDENT_AMBULATORY_CARE_PROVIDER_SITE_OTHER): Payer: BLUE CROSS/BLUE SHIELD | Admitting: Obstetrics

## 2019-06-29 ENCOUNTER — Encounter: Payer: Self-pay | Admitting: Obstetrics

## 2019-06-29 VITALS — BP 97/70 | HR 98 | Wt 126.0 lb

## 2019-06-29 DIAGNOSIS — Z3480 Encounter for supervision of other normal pregnancy, unspecified trimester: Secondary | ICD-10-CM

## 2019-06-29 DIAGNOSIS — Z3A29 29 weeks gestation of pregnancy: Secondary | ICD-10-CM

## 2019-06-29 NOTE — Progress Notes (Signed)
Subjective:  Latasha Kramer is a 29 y.o. G3P2002 at [redacted]w[redacted]d being seen today for ongoing prenatal care.  She is currently monitored for the following issues for this low-risk pregnancy and has Supervision of other normal pregnancy, antepartum and Fetal cardiac echogenic focus on their problem list.  Patient reports no complaints.  Contractions: Not present. Vag. Bleeding: None.  Movement: Present. Denies leaking of fluid.   The following portions of the patient's history were reviewed and updated as appropriate: allergies, current medications, past family history, past medical history, past social history, past surgical history and problem list. Problem list updated.  Objective:   Vitals:   06/29/19 0843  BP: 97/70  Pulse: 98  Weight: 126 lb (57.2 kg)    Fetal Status:     Movement: Present     General:  Alert, oriented and cooperative. Patient is in no acute distress.  Skin: Skin is warm and dry. No rash noted.   Cardiovascular: Normal heart rate noted  Respiratory: Normal respiratory effort, no problems with respiration noted  Abdomen: Soft, gravid, appropriate for gestational age. Pain/Pressure: Absent     Pelvic:  Cervical exam deferred        Extremities: Normal range of motion.     Mental Status: Normal mood and affect. Normal behavior. Normal judgment and thought content.   Urinalysis:      Assessment and Plan:  Pregnancy: G3P2002 at [redacted]w[redacted]d  1. Supervision of other normal pregnancy, antepartum   Preterm labor symptoms and general obstetric precautions including but not limited to vaginal bleeding, contractions, leaking of fluid and fetal movement were reviewed in detail with the patient. Please refer to After Visit Summary for other counseling recommendations.   Return in about 2 weeks (around 07/13/2019) for MyChart.   Brock Bad, MD  06/29/19

## 2019-07-13 ENCOUNTER — Encounter: Payer: Self-pay | Admitting: Obstetrics

## 2019-07-13 ENCOUNTER — Telehealth (INDEPENDENT_AMBULATORY_CARE_PROVIDER_SITE_OTHER): Payer: BLUE CROSS/BLUE SHIELD | Admitting: Obstetrics

## 2019-07-13 VITALS — BP 112/77 | HR 84

## 2019-07-13 DIAGNOSIS — O358XX0 Maternal care for other (suspected) fetal abnormality and damage, not applicable or unspecified: Secondary | ICD-10-CM

## 2019-07-13 DIAGNOSIS — K5900 Constipation, unspecified: Secondary | ICD-10-CM

## 2019-07-13 DIAGNOSIS — Z3A31 31 weeks gestation of pregnancy: Secondary | ICD-10-CM

## 2019-07-13 DIAGNOSIS — Z3480 Encounter for supervision of other normal pregnancy, unspecified trimester: Secondary | ICD-10-CM

## 2019-07-13 DIAGNOSIS — IMO0001 Reserved for inherently not codable concepts without codable children: Secondary | ICD-10-CM

## 2019-07-13 DIAGNOSIS — O99613 Diseases of the digestive system complicating pregnancy, third trimester: Secondary | ICD-10-CM

## 2019-07-13 MED ORDER — POLYETHYLENE GLYCOL 3350 17 G PO PACK: 17.0000 g | PACK | Freq: Every day | ORAL | 5 refills | Status: AC

## 2019-07-13 MED ORDER — DOCUSATE SODIUM 100 MG PO CAPS: 100.0000 mg | ORAL_CAPSULE | Freq: Two times a day (BID) | ORAL | 0 refills | Status: DC

## 2019-07-13 NOTE — Progress Notes (Signed)
Virtual ROB   CC: Constipation for the last 3 days .    Pt was able to check B/P while on the phone. B/P 112/77 P: 84

## 2019-07-13 NOTE — Progress Notes (Signed)
   TELEHEALTH OBSTETRICS VISIT ENCOUNTER NOTE  I connected with Latasha Kramer on 07/13/19 at  8:30 AM EDT by telephone at home and verified that I am speaking with the correct person using two identifiers.   I discussed the limitations, risks, security and privacy concerns of performing an evaluation and management service by telephone and the availability of in person appointments. I also discussed with the patient that there may be a patient responsible charge related to this service. The patient expressed understanding and agreed to proceed.  Subjective:  Latasha Kramer is a 29 y.o. G3P2002 at [redacted]w[redacted]d being followed for ongoing prenatal care.  She is currently monitored for the following issues for this low-risk pregnancy and has Supervision of other normal pregnancy, antepartum and Fetal cardiac echogenic focus on their problem list.  Patient reports constipation. Reports fetal movement. Denies any contractions, bleeding or leaking of fluid.   The following portions of the patient's history were reviewed and updated as appropriate: allergies, current medications, past family history, past medical history, past social history, past surgical history and problem list.   Objective:   General:  Alert, oriented and cooperative.   Mental Status: Normal mood and affect perceived. Normal judgment and thought content.  Rest of physical exam deferred due to type of encounter  Assessment and Plan:  Pregnancy: G3P2002 at [redacted]w[redacted]d  1. Supervision of other normal pregnancy, antepartum  2. Fetal cardiac echogenic focus, single or unspecified fetus  3. Constipation during pregnancy in third trimester Rx: - polyethylene glycol (MIRALAX) 17 g packet; Take 17 g by mouth at bedtime.  Dispense: 60 each; Refill: 5 - docusate sodium (COLACE) 100 MG capsule; Take 1 capsule (100 mg total) by mouth 2 (two) times daily.  Dispense: 10 capsule; Refill: 0   Preterm labor symptoms and general obstetric precautions including  but not limited to vaginal bleeding, contractions, leaking of fluid and fetal movement were reviewed in detail with the patient.  I discussed the assessment and treatment plan with the patient. The patient was provided an opportunity to ask questions and all were answered. The patient agreed with the plan and demonstrated an understanding of the instructions. The patient was advised to call back or seek an in-person office evaluation/go to MAU at [facility] for any urgent or concerning symptoms. Please refer to After Visit Summary for other counseling recommendations.   I provided 15 minutes of non-face-to-face time during this encounter.  Return in about 2 weeks (around 07/27/2019) for Telephone OB.  Coral Ceo, MD Center for [facility], [facility] 07/13/19

## 2019-07-27 ENCOUNTER — Encounter: Payer: Self-pay | Admitting: Obstetrics

## 2019-07-27 ENCOUNTER — Telehealth (INDEPENDENT_AMBULATORY_CARE_PROVIDER_SITE_OTHER): Payer: BLUE CROSS/BLUE SHIELD | Admitting: Obstetrics

## 2019-07-27 VITALS — BP 101/70 | HR 86

## 2019-07-27 DIAGNOSIS — Z3480 Encounter for supervision of other normal pregnancy, unspecified trimester: Secondary | ICD-10-CM

## 2019-07-27 DIAGNOSIS — O358XX0 Maternal care for other (suspected) fetal abnormality and damage, not applicable or unspecified: Secondary | ICD-10-CM

## 2019-07-27 DIAGNOSIS — IMO0001 Reserved for inherently not codable concepts without codable children: Secondary | ICD-10-CM

## 2019-07-27 DIAGNOSIS — Z3A33 33 weeks gestation of pregnancy: Secondary | ICD-10-CM

## 2019-07-27 NOTE — Progress Notes (Signed)
Pt is on the phone preparing for virtual visit with provider, [redacted]w[redacted]d.

## 2019-07-27 NOTE — Progress Notes (Signed)
   OBSTETRICS PRENATAL VIRTUAL VISIT ENCOUNTER NOTE  Provider location: Center for [facility] at [place]   I connected with Elijio Miles on 07/27/19 at  8:30 AM EDT by MyChart Video Encounter at home and verified that I am speaking with the correct person using two identifiers.   I discussed the limitations, risks, security and privacy concerns of performing an evaluation and management service virtually and the availability of in person appointments. I also discussed with the patient that there may be a patient responsible charge related to this service. The patient expressed understanding and agreed to proceed. Subjective:  Jaymie Mckiddy is a 29 y.o. G3P2002 at [redacted]w[redacted]d being seen today for ongoing prenatal care.  She is currently monitored for the following issues for this low-risk pregnancy and has Supervision of other normal pregnancy, antepartum and Fetal cardiac echogenic focus on their problem list.  Patient reports no complaints.  Contractions: Not present. Vag. Bleeding: None.  Movement: Present. Denies any leaking of fluid.   The following portions of the patient's history were reviewed and updated as appropriate: allergies, current medications, past family history, past medical history, past social history, past surgical history and problem list.   Objective:   Vitals:   07/27/19 0837  BP: 101/70  Pulse: 86    Fetal Status:     Movement: Present     General:  Alert, oriented and cooperative. Patient is in no acute distress.  Respiratory: Normal respiratory effort, no problems with respiration noted  Mental Status: Normal mood and affect. Normal behavior. Normal judgment and thought content.  Rest of physical exam deferred due to type of encounter  Imaging: No results found.  Assessment and Plan:  Pregnancy: G3P2002 at [redacted]w[redacted]d 1. Supervision of other normal pregnancy, antepartum   2. Fetal cardiac echogenic focus, single or unspecified fetus   Preterm labor symptoms and  general obstetric precautions including but not limited to vaginal bleeding, contractions, leaking of fluid and fetal movement were reviewed in detail with the patient. I discussed the assessment and treatment plan with the patient. The patient was provided an opportunity to ask questions and all were answered. The patient agreed with the plan and demonstrated an understanding of the instructions. The patient was advised to call back or seek an in-person office evaluation/go to MAU at [facility] for any urgent or concerning symptoms. Please refer to After Visit Summary for other counseling recommendations.   I provided 10 minutes of face-to-face time during this encounter.  Return in about 2 weeks (around 08/10/2019) for MyChart.   Coral Ceo, MD Center for [facility], [facility] 07/27/19

## 2019-08-11 ENCOUNTER — Encounter: Payer: Self-pay | Admitting: Obstetrics

## 2019-08-11 ENCOUNTER — Telehealth (INDEPENDENT_AMBULATORY_CARE_PROVIDER_SITE_OTHER): Payer: BLUE CROSS/BLUE SHIELD | Admitting: Obstetrics

## 2019-08-11 VITALS — BP 108/73 | HR 102

## 2019-08-11 DIAGNOSIS — Z3483 Encounter for supervision of other normal pregnancy, third trimester: Secondary | ICD-10-CM

## 2019-08-11 DIAGNOSIS — Z3A35 35 weeks gestation of pregnancy: Secondary | ICD-10-CM

## 2019-08-11 DIAGNOSIS — Z3480 Encounter for supervision of other normal pregnancy, unspecified trimester: Secondary | ICD-10-CM

## 2019-08-11 NOTE — Progress Notes (Signed)
Patient reports fetal movement with irregular contractions. 

## 2019-08-11 NOTE — Progress Notes (Signed)
   OBSTETRICS PRENATAL VIRTUAL VISIT ENCOUNTER NOTE  Provider location: Center for [facility] [place]   I connected with Latasha Kramer on 08/11/19 at  8:30 AM EDT by MyChart Video Encounter at home and verified that I am speaking with the correct person using two identifiers.   I discussed the limitations, risks, security and privacy concerns of performing an evaluation and management service virtually and the availability of in person appointments. I also discussed with the patient that there may be a patient responsible charge related to this service. The patient expressed understanding and agreed to proceed. Subjective:  Latasha Kramer is a 29 y.o. G3P2002 at [redacted]w[redacted]d being seen today for ongoing prenatal care.  She is currently monitored for the following issues for this low-risk pregnancy and has Supervision of other normal pregnancy, antepartum and Fetal cardiac echogenic focus on their problem list.  Patient reports no complaints.  Contractions: Irregular. Vag. Bleeding: None.  Movement: Present. Denies any leaking of fluid.   The following portions of the patient's history were reviewed and updated as appropriate: allergies, current medications, past family history, past medical history, past social history, past surgical history and problem list.   Objective:   Vitals:   08/11/19 0832  BP: 108/73  Pulse: (!) 102    Fetal Status:     Movement: Present     General:  Alert, oriented and cooperative. Patient is in no acute distress.  Respiratory: Normal respiratory effort, no problems with respiration noted  Mental Status: Normal mood and affect. Normal behavior. Normal judgment and thought content.  Rest of physical exam deferred due to type of encounter  Imaging: No results found.  Assessment and Plan:  Pregnancy: G3P2002 at [redacted]w[redacted]d 1. Supervision of other normal pregnancy, antepartum   Preterm labor symptoms and general obstetric precautions including but not limited to  vaginal bleeding, contractions, leaking of fluid and fetal movement were reviewed in detail with the patient. I discussed the assessment and treatment plan with the patient. The patient was provided an opportunity to ask questions and all were answered. The patient agreed with the plan and demonstrated an understanding of the instructions. The patient was advised to call back or seek an in-person office evaluation/go to MAU at [facility] for any urgent or concerning symptoms. Please refer to After Visit Summary for other counseling recommendations.   I provided 10 minutes of face-to-face time during this encounter.  Return in about 1 week (around 08/18/2019) for ROB.  GBS.  Needs TDAP.   Coral Ceo, MD Center for [facility], [facility] 08/11/19 .......................................Marland Kitchen

## 2019-08-18 ENCOUNTER — Encounter: Payer: Self-pay | Admitting: Obstetrics

## 2019-08-18 ENCOUNTER — Other Ambulatory Visit: Payer: Self-pay

## 2019-08-18 ENCOUNTER — Ambulatory Visit (INDEPENDENT_AMBULATORY_CARE_PROVIDER_SITE_OTHER): Payer: BLUE CROSS/BLUE SHIELD | Admitting: Obstetrics

## 2019-08-18 ENCOUNTER — Inpatient Hospital Stay (HOSPITAL_COMMUNITY): Admit: 2019-08-18 | Payer: BLUE CROSS/BLUE SHIELD

## 2019-08-18 VITALS — BP 104/72 | HR 97 | Wt 134.0 lb

## 2019-08-18 DIAGNOSIS — Z23 Encounter for immunization: Secondary | ICD-10-CM

## 2019-08-18 DIAGNOSIS — Z3483 Encounter for supervision of other normal pregnancy, third trimester: Secondary | ICD-10-CM

## 2019-08-18 DIAGNOSIS — Z3A36 36 weeks gestation of pregnancy: Secondary | ICD-10-CM

## 2019-08-18 DIAGNOSIS — Z3480 Encounter for supervision of other normal pregnancy, unspecified trimester: Secondary | ICD-10-CM

## 2019-08-18 NOTE — Progress Notes (Signed)
ROB  *Pt Wants T-DAP  Pt wants to discuss GBS pt has concerns.    CC : None

## 2019-08-18 NOTE — Progress Notes (Signed)
Subjective:  Latasha Kramer is a 29 y.o. G3P2002 at [redacted]w[redacted]d being seen today for ongoing prenatal care.  She is currently monitored for the following issues for this low-risk pregnancy and has Supervision of other normal pregnancy, antepartum and Fetal cardiac echogenic focus on their problem list.  Patient reports no complaints.  Contractions: Irritability. Vag. Bleeding: None.  Movement: Present. Denies leaking of fluid.   The following portions of the patient's history were reviewed and updated as appropriate: allergies, current medications, past family history, past medical history, past social history, past surgical history and problem list. Problem list updated.  Objective:   Vitals:   08/18/19 0859  BP: 104/72  Pulse: 97  Weight: 134 lb (60.8 kg)    Fetal Status:     Movement: Present     General:  Alert, oriented and cooperative. Patient is in no acute distress.  Skin: Skin is warm and dry. No rash noted.   Cardiovascular: Normal heart rate noted  Respiratory: Normal respiratory effort, no problems with respiration noted  Abdomen: Soft, gravid, appropriate for gestational age. Pain/Pressure: Present     Pelvic:  Cervical exam deferred        Extremities: Normal range of motion.  Edema: None  Mental Status: Normal mood and affect. Normal behavior. Normal judgment and thought content.   Urinalysis:      Assessment and Plan:  Pregnancy: G3P2002 at [redacted]w[redacted]d  1. Supervision of other normal pregnancy, antepartum Rx: - Strep Gp B NAA - Cervicovaginal ancillary only( [facility])  2. Need for Tdap vaccination Rx: - Tdap vaccine greater than or equal to 7yo IM   Preterm labor symptoms and general obstetric precautions including but not limited to vaginal bleeding, contractions, leaking of fluid and fetal movement were reviewed in detail with the patient. Please refer to After Visit Summary for other counseling recommendations.   Return in about 1 week (around 08/25/2019) for  MyChart.   Brock Bad, MD  08/18/19

## 2019-08-19 LAB — CERVICOVAGINAL ANCILLARY ONLY
Bacterial Vaginitis (gardnerella): NEGATIVE
Candida Glabrata: NEGATIVE
Candida Vaginitis: POSITIVE — AB
Chlamydia: NEGATIVE
Comment: NEGATIVE
Comment: NEGATIVE
Comment: NEGATIVE
Comment: NEGATIVE
Comment: NEGATIVE
Comment: NORMAL
Neisseria Gonorrhea: NEGATIVE
Trichomonas: NEGATIVE

## 2019-08-20 LAB — STREP GP B NAA: Strep Gp B NAA: NEGATIVE

## 2019-08-23 ENCOUNTER — Other Ambulatory Visit: Payer: Self-pay | Admitting: Obstetrics

## 2019-08-24 ENCOUNTER — Other Ambulatory Visit: Payer: Self-pay | Admitting: Obstetrics

## 2019-08-24 DIAGNOSIS — B3731 Acute candidiasis of vulva and vagina: Secondary | ICD-10-CM

## 2019-08-24 MED ORDER — TERCONAZOLE 0.8 % VA CREA: 1.0000 | TOPICAL_CREAM | Freq: Every day | VAGINAL | 0 refills | Status: AC

## 2019-08-25 ENCOUNTER — Telehealth (INDEPENDENT_AMBULATORY_CARE_PROVIDER_SITE_OTHER): Payer: BLUE CROSS/BLUE SHIELD | Admitting: Advanced Practice Midwife

## 2019-08-25 VITALS — BP 109/73 | HR 86

## 2019-08-25 DIAGNOSIS — Z3483 Encounter for supervision of other normal pregnancy, third trimester: Secondary | ICD-10-CM

## 2019-08-25 DIAGNOSIS — Z3480 Encounter for supervision of other normal pregnancy, unspecified trimester: Secondary | ICD-10-CM

## 2019-08-25 DIAGNOSIS — Z3A37 37 weeks gestation of pregnancy: Secondary | ICD-10-CM

## 2019-08-25 NOTE — Progress Notes (Signed)
I connected with  Latasha Kramer on 08/25/19 by a video enabled telemedicine application and verified that I am speaking with the correct person using two identifiers.   I discussed the limitations of evaluation and management by telemedicine. The patient expressed understanding and agreed to proceed.  MyChart OB, reports no problems today.

## 2019-08-25 NOTE — Progress Notes (Signed)
   OBSTETRICS PRENATAL VIRTUAL VISIT ENCOUNTER NOTE  Provider location: Center for [facility] at [place]   I connected with Elijio Miles on 08/25/19 at 27:55 AM EDT by MyChart Video Encounter at home and verified that I am speaking with the correct person using two identifiers.   I discussed the limitations, risks, security and privacy concerns of performing an evaluation and management service virtually and the availability of in person appointments. I also discussed with the patient that there may be a patient responsible charge related to this service. The patient expressed understanding and agreed to proceed. Subjective:  Latasha Kramer is a 29 y.o. G3P2002 at [redacted]w[redacted]d being seen today for ongoing prenatal care.  She is currently monitored for the following issues for this low-risk pregnancy and has Supervision of other normal pregnancy, antepartum and Fetal cardiac echogenic focus on their problem list.  Patient reports occasional contractions.  Contractions: Not present. Vag. Bleeding: None.  Movement: Present. Denies any leaking of fluid.   The following portions of the patient's history were reviewed and updated as appropriate: allergies, current medications, past family history, past medical history, past social history, past surgical history and problem list.   Objective:   Vitals:   08/25/19 0840  BP: 109/73  Pulse: 86    Fetal Status:     Movement: Present     General:  Alert, oriented and cooperative. Patient is in no acute distress.  Respiratory: Normal respiratory effort, no problems with respiration noted  Mental Status: Normal mood and affect. Normal behavior. Normal judgment and thought content.  Rest of physical exam deferred due to type of encounter  Imaging: No results found.  Assessment and Plan:  Pregnancy: G3P2002 at [redacted]w[redacted]d 1. Supervision of other normal pregnancy, antepartum --Pt reports good fetal movement, denies cramping, LOF, or vaginal  bleeding --Anticipatory guidance about next visits/weeks of pregnancy given. --Next visit in 1 week in the office  Term labor symptoms and general obstetric precautions including but not limited to vaginal bleeding, contractions, leaking of fluid and fetal movement were reviewed in detail with the patient. I discussed the assessment and treatment plan with the patient. The patient was provided an opportunity to ask questions and all were answered. The patient agreed with the plan and demonstrated an understanding of the instructions. The patient was advised to call back or seek an in-person office evaluation/go to MAU at [facility] for any urgent or concerning symptoms. Please refer to After Visit Summary for other counseling recommendations.   I provided 5 minutes of face-to-face time during this encounter.  Return in about 1 week (around 09/01/2019).  No future appointments.  Sharen Counter, CNM Center for [employer], [facility]

## 2019-09-01 ENCOUNTER — Ambulatory Visit (INDEPENDENT_AMBULATORY_CARE_PROVIDER_SITE_OTHER): Payer: BLUE CROSS/BLUE SHIELD | Admitting: Obstetrics

## 2019-09-01 ENCOUNTER — Encounter: Payer: Self-pay | Admitting: Obstetrics

## 2019-09-01 ENCOUNTER — Other Ambulatory Visit: Payer: Self-pay

## 2019-09-01 VITALS — BP 112/78 | HR 89 | Wt 136.5 lb

## 2019-09-01 DIAGNOSIS — Z3480 Encounter for supervision of other normal pregnancy, unspecified trimester: Secondary | ICD-10-CM

## 2019-09-01 DIAGNOSIS — Z3483 Encounter for supervision of other normal pregnancy, third trimester: Secondary | ICD-10-CM

## 2019-09-01 DIAGNOSIS — Z3A38 38 weeks gestation of pregnancy: Secondary | ICD-10-CM

## 2019-09-01 NOTE — Progress Notes (Signed)
Subjective:  Latasha Kramer is a 29 y.o. G3P2002 at [redacted]w[redacted]d being seen today for ongoing prenatal care.  She is currently monitored for the following issues for this low-risk pregnancy and has Supervision of other normal pregnancy, antepartum and Fetal cardiac echogenic focus on their problem list.  Patient reports no complaints.  Contractions: Irregular. Vag. Bleeding: None.  Movement: Present. Denies leaking of fluid.   The following portions of the patient's history were reviewed and updated as appropriate: allergies, current medications, past family history, past medical history, past social history, past surgical history and problem list. Problem list updated.  Objective:   Vitals:   09/01/19 0907  BP: 112/78  Pulse: 89  Weight: 136 lb 8 oz (61.9 kg)    Fetal Status:     Movement: Present     General:  Alert, oriented and cooperative. Patient is in no acute distress.  Skin: Skin is warm and dry. No rash noted.   Cardiovascular: Normal heart rate noted  Respiratory: Normal respiratory effort, no problems with respiration noted  Abdomen: Soft, gravid, appropriate for gestational age. Pain/Pressure: Present     Pelvic:  Cervical exam deferred        Extremities: Normal range of motion.  Edema: None  Mental Status: Normal mood and affect. Normal behavior. Normal judgment and thought content.   Urinalysis:      Assessment and Plan:  Pregnancy: G3P2002 at [redacted]w[redacted]d  There are no diagnoses linked to this encounter. Term labor symptoms and general obstetric precautions including but not limited to vaginal bleeding, contractions, leaking of fluid and fetal movement were reviewed in detail with the patient. Please refer to After Visit Summary for other counseling recommendations.   Return in about 1 week (around 09/08/2019) for ROB.   Brock Bad, MD  09/01/19

## 2019-09-01 NOTE — Progress Notes (Signed)
Pt. Presents for ROB. Denies questions or concerns at this time.

## 2019-09-08 ENCOUNTER — Encounter: Payer: BLUE CROSS/BLUE SHIELD | Admitting: Obstetrics

## 2019-09-08 ENCOUNTER — Inpatient Hospital Stay (HOSPITAL_COMMUNITY)
Admission: AD | Admit: 2019-09-08 | Discharge: 2019-09-09 | DRG: 807 | Disposition: A | Discharge status: Home / Self Care | Payer: BLUE CROSS/BLUE SHIELD | Attending: Family Medicine | Admitting: Family Medicine

## 2019-09-08 ENCOUNTER — Encounter (HOSPITAL_COMMUNITY): Payer: Self-pay | Admitting: Obstetrics and Gynecology

## 2019-09-08 DIAGNOSIS — Z3A39 39 weeks gestation of pregnancy: Secondary | ICD-10-CM | POA: Diagnosis not present

## 2019-09-08 DIAGNOSIS — IMO0001 Reserved for inherently not codable concepts without codable children: Secondary | ICD-10-CM

## 2019-09-08 DIAGNOSIS — O358XX0 Maternal care for other (suspected) fetal abnormality and damage, not applicable or unspecified: Secondary | ICD-10-CM

## 2019-09-08 DIAGNOSIS — Z20822 Contact with and (suspected) exposure to covid-19: Secondary | ICD-10-CM | POA: Diagnosis present

## 2019-09-08 DIAGNOSIS — O26893 Other specified pregnancy related conditions, third trimester: Secondary | ICD-10-CM | POA: Diagnosis present

## 2019-09-08 LAB — CBC
HCT: 39.8 % (ref 36.0–46.0)
Hemoglobin: 12.6 g/dL (ref 12.0–15.0)
MCH: 25.7 pg — ABNORMAL LOW (ref 26.0–34.0)
MCHC: 31.7 g/dL (ref 30.0–36.0)
MCV: 81.1 fL (ref 80.0–100.0)
Platelets: 166 10*3/uL (ref 150–400)
RBC: 4.91 MIL/uL (ref 3.87–5.11)
RDW: 15.8 % — ABNORMAL HIGH (ref 11.5–15.5)
WBC: 12.5 10*3/uL — ABNORMAL HIGH (ref 4.0–10.5)
nRBC: 0 % (ref 0.0–0.2)

## 2019-09-08 LAB — SARS CORONAVIRUS 2 BY RT PCR (HOSPITAL ORDER, PERFORMED IN ~~LOC~~ HOSPITAL LAB): SARS Coronavirus 2: NEGATIVE

## 2019-09-08 LAB — TYPE AND SCREEN
ABO/RH(D): O POS
Antibody Screen: NEGATIVE

## 2019-09-08 MED ORDER — PRENATAL MULTIVITAMIN CH
1.0000 | ORAL_TABLET | Freq: Every day | ORAL | Status: DC
  Administered 2019-09-08 – 2019-09-09 (×2): 1 via ORAL
  Filled 2019-09-08 (×2): qty 1

## 2019-09-08 MED ORDER — METHYLERGONOVINE MALEATE 0.2 MG/ML IJ SOLN
0.2000 mg | Freq: Once | INTRAMUSCULAR | Status: AC
  Administered 2019-09-08: 0.2 mg via INTRAMUSCULAR

## 2019-09-08 MED ORDER — OXYTOCIN 10 UNIT/ML IJ SOLN
10.0000 [IU] | Freq: Once | INTRAMUSCULAR | Status: AC
  Administered 2019-09-08: 10 [IU] via INTRAMUSCULAR

## 2019-09-08 MED ORDER — IBUPROFEN 600 MG PO TABS
600.0000 mg | ORAL_TABLET | Freq: Four times a day (QID) | ORAL | Status: DC
  Administered 2019-09-08 – 2019-09-09 (×5): 600 mg via ORAL
  Filled 2019-09-08 (×5): qty 1

## 2019-09-08 MED ORDER — OXYTOCIN 10 UNIT/ML IJ SOLN
INTRAMUSCULAR | Status: AC
  Filled 2019-09-08: qty 1

## 2019-09-08 MED ORDER — OXYCODONE-ACETAMINOPHEN 5-325 MG PO TABS
2.0000 | ORAL_TABLET | Freq: Four times a day (QID) | ORAL | Status: AC | PRN
  Administered 2019-09-08 (×2): 2 via ORAL
  Filled 2019-09-08 (×2): qty 2

## 2019-09-08 MED ORDER — DIPHENHYDRAMINE HCL 25 MG PO CAPS: 25.0000 mg | ORAL_CAPSULE | Freq: Four times a day (QID) | ORAL | Status: DC | PRN

## 2019-09-08 MED ORDER — DIBUCAINE (PERIANAL) 1 % EX OINT: 1.0000 "application " | TOPICAL_OINTMENT | CUTANEOUS | Status: DC | PRN

## 2019-09-08 MED ORDER — METHYLERGONOVINE MALEATE 0.2 MG/ML IJ SOLN
INTRAMUSCULAR | Status: AC
  Filled 2019-09-08: qty 1

## 2019-09-08 MED ORDER — ACETAMINOPHEN 325 MG PO TABS
650.0000 mg | ORAL_TABLET | ORAL | Status: DC | PRN
  Administered 2019-09-09: 650 mg via ORAL
  Filled 2019-09-08: qty 2

## 2019-09-08 MED ORDER — SIMETHICONE 80 MG PO CHEW: 80.0000 mg | CHEWABLE_TABLET | ORAL | Status: DC | PRN

## 2019-09-08 MED ORDER — SENNOSIDES-DOCUSATE SODIUM 8.6-50 MG PO TABS
2.0000 | ORAL_TABLET | ORAL | Status: DC
  Administered 2019-09-08: 2 via ORAL
  Filled 2019-09-08: qty 2

## 2019-09-08 MED ORDER — ONDANSETRON HCL 4 MG/2ML IJ SOLN: 4.0000 mg | INTRAMUSCULAR | Status: DC | PRN

## 2019-09-08 MED ORDER — WITCH HAZEL-GLYCERIN EX PADS: 1.0000 "application " | MEDICATED_PAD | CUTANEOUS | Status: DC | PRN

## 2019-09-08 MED ORDER — BENZOCAINE-MENTHOL 20-0.5 % EX AERO: 1.0000 "application " | INHALATION_SPRAY | CUTANEOUS | Status: DC | PRN

## 2019-09-08 MED ORDER — COCONUT OIL OIL: 1.0000 "application " | TOPICAL_OIL | Status: DC | PRN

## 2019-09-08 MED ORDER — TETANUS-DIPHTH-ACELL PERTUSSIS 5-2.5-18.5 LF-MCG/0.5 IM SUSP: 0.5000 mL | Freq: Once | INTRAMUSCULAR | Status: DC

## 2019-09-08 MED ORDER — ONDANSETRON HCL 4 MG PO TABS: 4.0000 mg | ORAL_TABLET | ORAL | Status: DC | PRN

## 2019-09-08 NOTE — MAU Note (Signed)
...  Latasha Kramer is a 29 y.o. at [redacted]w[redacted]d here in MAU reporting: CTX that began at 0600. Pt reports they have been 5 minutes apart. +FM. Light VB. No LOF.  FHT: 125

## 2019-09-08 NOTE — H&P (Signed)
LABOR AND DELIVERY ADMISSION HISTORY AND PHYSICAL NOTE  Latasha Kramer is a 29 y.o. female G3P2002 with IUP at 38w3dby LMP presenting for SOL/SROM. She reports positive fetal movement. She presents to MAU 8cm.  [facility]  - Fetal cardiac echogenic focus  Past Medical History: History reviewed. No pertinent past medical history.  Past Surgical History: History reviewed. No pertinent surgical history.  Obstetrical History: OB History    Gravida  3   Para  2   Term  2   Preterm  0   AB  0   Living  2     SAB  0   TAB  0   Ectopic  0   Multiple  0   Live Births  1           Social History: Social History   Socioeconomic History  . Marital status: Married    Spouse name: Not on file  . Number of children: 2  . Years of education: Not on file  . Highest education level: Not on file  Occupational History  . Occupation: [employer]ING [employer]  Tobacco Use  . Smoking status: Never Smoker  . Smokeless tobacco: Never Used  Vaping Use  . Vaping Use: Never used  Substance and Sexual Activity  . Alcohol use: No  . Drug use: No  . Sexual activity: Yes    Birth control/protection: None  Other Topics Concern  . Not on file  Social History Narrative  . Not on file   Social Determinants of Health   Financial Resource Strain:   . Difficulty of Paying Living Expenses:   Food Insecurity:   . Worried About RCharity fundraiserin the Last Year:   . RArboriculturistin the Last Year:   Transportation Needs:   . LFilm/video editor(Medical):   .Marland KitchenLack of Transportation (Non-Medical):   Physical Activity:   . Days of Exercise per Week:   . Minutes of Exercise per Session:   Stress:   . Feeling of Stress :   Social Connections:   . Frequency of Communication with Friends and Family:   . Frequency of Social Gatherings with Friends and Family:   . Attends Religious Services:   . Active Member of Clubs or  Organizations:   . Attends CArchivistMeetings:   .Marland KitchenMarital Status:     Family History: Family History  Problem Relation Age of Onset  . Healthy Mother   . Healthy Father   . Healthy Sister   . Healthy Brother     Allergies: No Known Allergies  Medications Prior to Admission  Medication Sig Dispense Refill Last Dose  . Blood Pressure Monitoring (BLOOD PRESSURE KIT) DEVI 1 kit by Does not apply route once a week. Check Blood Pressure regularly and record readings into the Babyscripts App.  Large Cuff.  DX O90.0 1 each 0   . cephALEXin (KEFLEX) 500 MG capsule Take 500 mg by mouth 4 (four) times daily. (Patient not taking: Reported on 07/13/2019)     . docusate sodium (COLACE) 100 MG capsule Take 1 capsule (100 mg total) by mouth 2 (two) times daily. (Patient not taking: Reported on 08/18/2019) 10 capsule 0   . phenazopyridine (PYRIDIUM) 200 MG tablet Take 1 tablet (200 mg total) by mouth 3 (three) times daily as needed for pain. (Patient not taking: Reported on 04/08/2019) 6 tablet 0   . polyethylene glycol (MIRALAX)  17 g packet Take 17 g by mouth at bedtime. (Patient not taking: Reported on 08/18/2019) 60 each 5   . potassium chloride SA (K-DUR,KLOR-CON) 20 MEQ tablet Take 1 tablet (20 mEq total) by mouth daily. (Patient not taking: Reported on 04/08/2019) 3 tablet 0   . Prenat-FeCbn-FeAsp-Meth-FA-DHA (PRENATE MINI) 18-0.6-0.4-350 MG CAPS Take 1 capsule by mouth daily. 30 capsule 11   . terconazole (TERAZOL 3) 0.8 % vaginal cream Place 1 applicator vaginally at bedtime. 20 g 0      Review of Systems  All systems reviewed and negative except as stated in HPI  Physical Exam Blood pressure 111/78, pulse 74, resp. rate 18, last menstrual period 12/06/2018. General appearance: alert, cooperative and moderate distress Lungs: clear to auscultation bilaterally Heart: regular rate and rhythm Abdomen: soft, non-tender; bowel sounds normal Extremities: No calf swelling or  tenderness Presentation: cephalic Fetal monitoring: 130 Uterine activity: assessing  Dilation: 8 Effacement (%): 100 Station: 0, Plus 1 Exam by:: Roderic Scarce, RNC  Prenatal labs: ABO, Rh: O/Positive/-- (03/11 1141) Antibody: Negative (03/11 1141) Rubella: 1.95 (03/11 1141) RPR: Non Reactive (05/19 1055)  HBsAg: Negative (03/11 1141)  HIV: Non Reactive (05/19 1055)  GC/Chlamydia: negative GBS: Negative/-- (07/21 0953)  2 hr Glucola:  75-97-105, normal  Genetic screening: low risk, negative Anatomy US: EIF  Nursing Staff Provider  Office Location  [facility] Dating  LMP  Language   ENGLISH/JRAI Anatomy US  wnl except EIF  Flu Vaccine   Declined 04/08/19 Genetic Screen  NIPS:  Low risk female  AFP:  Pending      TDaP vaccine   Declined 06/16/2019, Hgb A1C or  GTT Early n/a Third trimester  Glucose, Fasting 65 - 91 mg/dL 75   Glucose, 1 hour 65 - 179 mg/dL 97   Glucose, 2 hour 65 - 152 mg/dL 105     Rhogam   n/a   LAB RESULTS   Feeding Plan  breastfeed & Bottle Blood Type O/Positive/-- (03/11 1141)   Contraception  Unsure/condoms/IUD, info given 06/16/2019 Antibody Negative (03/11 1141)  Circumcision female Rubella 1.95 (03/11 1141)  [place] [facility] RPR Non Reactive (05/19 1055)   Support Person  Husband HBsAg Negative (03/11 1141)   Prenatal Classes  HIV Non Reactive (05/19 1055)  BTL Consent  GBS  (For PCN allergy, check sensitivities)   VBAC Consent  Pap Normal 2019    Hgb Electro  neg  BP Cuff  ordered 04/08/2019 CF neg    SMA neg    Waterbirth  _0  Class _1  Consent _2  CNM visit    Prenatal Transfer Tool  Maternal Diabetes: No Genetic Screening: Normal Maternal Ultrasounds/Referrals: Isolated EIF (echogenic intracardiac focus) Fetal Ultrasounds or other Referrals:  None Maternal Substance Abuse:  No Significant Maternal Medications:  None Significant Maternal Lab Results: Group B Strep negative  No results found for this or  any previous visit (from the past 24 hour(s)).  Patient Active Problem List   Diagnosis Date Noted  . SVD (spontaneous vaginal delivery) 09/08/2019  . Fetal cardiac echogenic focus 05/21/2019  . Supervision of other normal pregnancy, antepartum 04/07/2019    Assessment: Reeda Soohoo is a 29 y.o. G3P2002 at 51w3dhere for SOL/SROM  #Labor: delivering upon arrival to L&D #ID: GBS neg #MOF: Breast and bottle  #MOC:unsure at this time   RLajean Manes CNM 09/08/2019, 7:49 AM

## 2019-09-08 NOTE — Discharge Summary (Addendum)
Postpartum Discharge Summary     Patient Name: Latasha Kramer DOB: [birth year] MRN: [id number]  Date of admission: 09/08/2019 Delivery date:09/08/2019  Delivering provider: Wende Mott A  Date of discharge: 09/09/2019  Admitting diagnosis: Vaginal delivery [O80] Intrauterine pregnancy: [redacted]w[redacted]d    Secondary diagnosis:  Active Problems:   SVD (spontaneous vaginal delivery)   Precipitate labor, delivered, current hospitalization   Vaginal delivery  Additional problems: Language barrier     Discharge diagnosis: Term Pregnancy Delivered                                              Post partum procedures: none Augmentation: N/A Complications: None  Hospital course: Onset of Labor With Vaginal Delivery      29y.o. yo G3P2002 at 366w3das admitted in Active Labor on 09/08/2019. Patient had an uncomplicated labor course as follows:  Membrane Rupture Time/Date: 7:17 AM ,09/08/2019   Delivery Method:Vaginal, Spontaneous  Episiotomy: None  Lacerations:  1st degree;Perineal  Patient had an uncomplicated postpartum course.  She is ambulating, tolerating a regular diet, passing flatus, and urinating well. Patient is discharged home in stable condition on 09/09/19.  Newborn Data: Birth date:09/08/2019  Birth time:7:21 AM  Gender:Female  Living status:Living  Apgars:8 ,9  Weight:3291 g   Magnesium Sulfate received: No BMZ received: No Rhophylac:N/A MMR:N/A T-DaP: declined Flu: declined Transfusion:No  Physical exam  Vitals:   09/08/19 1500 09/08/19 2000 09/09/19 0000 09/09/19 0555  BP: 109/81 100/77 103/76 95/75  Pulse: 70 70 76 71  Resp:  19 18 16   Temp: 98.8 F (37.1 C) 97.7 F (36.5 C) 98 F (36.7 C) 97.8 F (36.6 C)  TempSrc: Oral Oral Oral Oral  SpO2: 99% 99% 98% 99%  Weight:      Height:       General: alert, cooperative and no distress Lochia: appropriate Uterine Fundus: firm Incision: N/A DVT Evaluation: No evidence of DVT seen on physical exam. Labs: Lab  Results  Component Value Date   WBC 10.3 09/09/2019   HGB 11.5 (L) 09/09/2019   HCT 36.4 09/09/2019   MCV 81.4 09/09/2019   PLT 170 09/09/2019   CMP Latest Ref Rng & Units 05/01/2017  Glucose 65 - 99 mg/dL 75  BUN 6 - 20 mg/dL 6  Creatinine 0.57 - 1.00 mg/dL 0.66  Sodium 134 - 144 mmol/L 140  Potassium 3.5 - 5.2 mmol/L 4.2  Chloride 96 - 106 mmol/L 105  CO2 20 - 29 mmol/L 21  Calcium 8.7 - 10.2 mg/dL 8.9  Total Protein 6.5 - 8.1 g/dL -  Total Bilirubin 0.3 - 1.2 mg/dL -  Alkaline Phos 38 - 126 U/L -  AST 15 - 41 U/L -  ALT 14 - 54 U/L -   Edinburgh Score: Edinburgh Postnatal Depression Scale Screening Tool 09/08/2019  I have been able to laugh and see the funny side of things. (No Data)     After visit meds:  Allergies as of 09/09/2019   No Known Allergies      Medication List     TAKE these medications    Blood Pressure Kit Devi 1 kit by Does not apply route once a week. Check Blood Pressure regularly and record readings into the Babyscripts App.  Large Cuff.  DX O90.0   cephALEXin 500 MG capsule Commonly known as: KEFLEX Take 500 mg  by mouth 4 (four) times daily.   docusate sodium 100 MG capsule Commonly known as: Colace Take 1 capsule (100 mg total) by mouth 2 (two) times daily.   ibuprofen 600 MG tablet Commonly known as: ADVIL Take 1 tablet (600 mg total) by mouth every 6 (six) hours.   phenazopyridine 200 MG tablet Commonly known as: Pyridium Take 1 tablet (200 mg total) by mouth 3 (three) times daily as needed for pain.   polyethylene glycol 17 g packet Commonly known as: MiraLax Take 17 g by mouth at bedtime.   potassium chloride SA 20 MEQ tablet Commonly known as: KLOR-CON Take 1 tablet (20 mEq total) by mouth daily.   Prenate Mini 18-0.6-0.4-350 MG Caps Take 1 capsule by mouth daily.   terconazole 0.8 % vaginal cream Commonly known as: TERAZOL 3 Place 1 applicator vaginally at bedtime.         Discharge home in stable  condition Infant Feeding: Breast Infant Disposition:home with mother Discharge instruction: per After Visit Summary and Postpartum booklet. Activity: Advance as tolerated. Pelvic rest for 6 weeks.  Diet: routine diet Future Appointments: Future Appointments  Date Time Provider [place]  10/06/2019  2:00 PM Leftwich-Kirby, Kathie Dike, CNM [facility] None   Follow up Visit:    Please schedule this patient for a In person postpartum visit in 4 weeks with the following provider: Any provider. Additional Postpartum F/U: contraception   Low risk pregnancy complicated by:  nothing Delivery mode:  Vaginal, Spontaneous  Anticipated Birth Control:  Unsure   09/09/2019 Matilde Haymaker, MD  Attestation of Supervision of Student:  I confirm that I have verified the information documented in the  resident's  note and that I have also personally reperformed the history, physical exam and all medical decision making activities.  I have verified that all services and findings are accurately documented in this student's note; and I agree with management and plan as outlined in the documentation. I have also made any necessary editorial changes.   Randa Ngo, [place] for [facility], [place] [facility] 09/09/2019 12:38 PM

## 2019-09-08 NOTE — MAU Note (Signed)
Patient transported to L&D with provider at bedside. 8 cm with a bulging bag. Covid swab collected.

## 2019-09-09 LAB — CBC
HCT: 36.4 % (ref 36.0–46.0)
Hemoglobin: 11.5 g/dL — ABNORMAL LOW (ref 12.0–15.0)
MCH: 25.7 pg — ABNORMAL LOW (ref 26.0–34.0)
MCHC: 31.6 g/dL (ref 30.0–36.0)
MCV: 81.4 fL (ref 80.0–100.0)
Platelets: 170 10*3/uL (ref 150–400)
RBC: 4.47 MIL/uL (ref 3.87–5.11)
RDW: 15.9 % — ABNORMAL HIGH (ref 11.5–15.5)
WBC: 10.3 10*3/uL (ref 4.0–10.5)
nRBC: 0 % (ref 0.0–0.2)

## 2019-09-09 LAB — RPR: RPR Ser Ql: NONREACTIVE

## 2019-09-09 MED ORDER — IBUPROFEN 600 MG PO TABS: 600.0000 mg | ORAL_TABLET | Freq: Four times a day (QID) | ORAL | 0 refills | Status: AC

## 2019-09-09 NOTE — Progress Notes (Addendum)
POSTPARTUM PROGRESS NOTE  Post Partum Day 1  Subjective:  Latasha Kramer is a 29 y.o. [id number] [redacted]w[redacted]d s/p SVD.  No acute events overnight.  Pt denies problems with ambulating, voiding or po intake.  She denies nausea or vomiting.  Pain is well controlled.  She has had flatus. She has not had bowel movement.  Lochia Moderate.   Objective: Blood pressure 95/75, pulse 71, temperature 97.8 F (36.6 C), temperature source Oral, resp. rate 16, height 5\' 1"  (1.549 m), weight 62.6 kg, last menstrual period 12/06/2018, SpO2 99 %, unknown if currently breastfeeding.  Physical Exam:  General: alert, cooperative and no distress Lochia:normal flow Chest: CTAB Heart: RRR no m/r/g Abdomen: +BS, soft, nontender,  Uterine Fundus: firm, below umbilicus DVT Evaluation: No calf swelling or tenderness Extremities: no edema  Assessment/Plan:  ASSESSMENT: Latasha Kramer is a 29 y.o. 34 [redacted]w[redacted]d s/p SVD  Plan for discharge tomorrow and Breastfeeding  OK to DC home today if baby is cleared to go.   LOS: 1 day   [redacted]w[redacted]d, MD 09/09/2019, 10:14 AM   Attestation of Supervision of Student:  I confirm that I have verified the information documented in the  resident's  note and that I have also personally reperformed the history, physical exam and all medical decision making activities.  I have verified that all services and findings are accurately documented in this student's note; and I agree with management and plan as outlined in the documentation. I have also made any necessary editorial changes.  11/09/2019, MD Center for [facility], [facility] 09/09/2019 12:34 PM

## 2019-09-09 NOTE — Discharge Instructions (Signed)

## 2019-09-09 NOTE — Progress Notes (Signed)
Discharge instructions and prescriptions given to pt. Discussed post vaginal delivery care, signs and symptoms to report to the MD, upcoming appointments, and meds. Pt verbalizes understanding and has no questions or concerns at this time. Pt discharged from hospital with baby in stable condition.

## 2019-09-10 ENCOUNTER — Telehealth: Payer: Self-pay | Admitting: *Deleted

## 2019-09-10 NOTE — Telephone Encounter (Signed)
Contacted patient to complete transition of care assessment:  Transition Care Management Follow-up Telephone Call  . Medicaid Managed Care Transition Call Status[facility]  . Date of discharge and from where: [facility], 09/09/19  . How have you been since you were released from the hospital? "ok"  . Any questions or concerns? Pain when urinating in area of tear. Pt instructed to contact OB/GYN regarding this. Perineum care instructions given to patient per discharge instructions. She verbalized understanding.   Items Reviewed: Marland Kitchen Did the pt receive and understand the discharge instructions provided? Yes  . Medications obtained and verified? Yes  . Any new allergies since your discharge? No  . Dietary orders reviewed? Yes . Do you have support at home? Yes, family  Functional Questionnaire: (I = Independent and D = Dependent)  ADLs: Independent Bathing/Dressing:Independent Meal Prep: Independent Eating: Independent Maintaining continence: Independent Transferring/Ambulation: Independent Managing Meds: Independent   Follow up appointments reviewed:  PCP Hospital f/u appt confirmed? No Patient would like to be assigned to a PCP   Specialist Hospital f/u appt confirmed? Yes Scheduled to see Sharen Counter on 09/29/19 @ 1400.  Are transportation arrangements needed? No   If their condition worsens, is the pt aware to call PCP or go to the EmergencyDept.? Yes  Was the patient provided with contact information for the PCP's office or ED? yes  Was to pt encouraged to call back with questions or concerns?  yes  Burnard Bunting, RN, BSN, CCRN Patient Engagement Center [phone]

## 2019-09-13 ENCOUNTER — Telehealth: Payer: Self-pay | Admitting: *Deleted

## 2019-09-13 NOTE — Telephone Encounter (Signed)
Email sent to  Internal Medicine to  request  NP appointment . °  °                             Otie Headlee °                                 PEC °                             [phone]       ° °

## 2019-10-06 ENCOUNTER — Ambulatory Visit: Payer: BLUE CROSS/BLUE SHIELD | Admitting: Advanced Practice Midwife

## 2019-10-18 ENCOUNTER — Encounter: Payer: Self-pay | Admitting: Obstetrics & Gynecology

## 2019-10-18 ENCOUNTER — Ambulatory Visit (INDEPENDENT_AMBULATORY_CARE_PROVIDER_SITE_OTHER): Payer: Medicaid Other | Admitting: Obstetrics & Gynecology

## 2019-10-18 ENCOUNTER — Other Ambulatory Visit: Payer: Self-pay

## 2019-10-18 NOTE — Progress Notes (Signed)
    Post Partum Visit Note  Latasha Kramer is a 29 y.o. G31P3003 female who presents for a postpartum visit. She is 5 weeks postpartum following a normal spontaneous vaginal delivery.  I have fully reviewed the prenatal and intrapartum course. The delivery was at 39 gestational weeks.  Anesthesia: none. Postpartum course has been unremarkable. Baby is doing well. Baby is feeding by [employer] GENTLE . Bleeding no bleeding. Bowel function is normal. Bladder function is normal. Patient is not sexually active. Contraception method is Undecided . Postpartum depression screening: negative.   The pregnancy intention screening data noted above was reviewed. Potential methods of contraception were discussed. The patient elected to proceed with Female Condom.      The following portions of the patient's history were reviewed and updated as appropriate: allergies, current medications, past family history, past medical history, past social history, past surgical history and problem list.  Review of Systems Pertinent items are noted in HPI.    Objective:  Last menstrual period 12/06/2018, unknown if currently breastfeeding.  General:  alert, cooperative and no distress           Abdomen: not distended   Vulva:  not evaluated  Vagina: not evaluated                    Assessment:    normal postpartum exam. Pap smear not done at today's visit.   Plan:   Essential components of care per ACOG recommendations:  1.  Mood and well being: Patient with negative depression screening today. Reviewed local resources for support.  - Patient does not use tobacco. 2. Infant care and feeding:  -Patient currently breastmilk feeding? No *-Social determinants of health (SDOH) reviewed in EPIC. No concerns  3. Sexuality, contraception and birth spacing - Patient does not want a pregnancy in the next year.  Desired family size is 3 children.  - Reviewed forms of contraception in tiered fashion. Patient desired  condoms today.   - Discussed birth spacing of 18 months  4. Sleep and fatigue -Encouraged family/partner/community support of 4 hrs of uninterrupted sleep to help with mood and fatigue  5. Physical Recovery  - Discussed patients delivery  - Patient has urinary incontinence? No - Patient is safe to resume physical and sexual activity  6.  Health Maintenance - Last pap smear done 04/08/19 and was normal with negative HPV.     Adam Phenix, MD  Center for [facility], [facility]

## 2019-10-18 NOTE — Patient Instructions (Signed)
Contraception Choices Contraception, also called birth control, refers to methods or devices that prevent pregnancy. Hormonal methods Contraceptive implant  A contraceptive implant is a thin, plastic tube that contains a hormone. It is inserted into the upper part of the arm. It can remain in place for up to 3 years. Progestin-only injections Progestin-only injections are injections of progestin, a synthetic form of the hormone progesterone. They are given every 3 months by a health care provider. Birth control pills  Birth control pills are pills that contain hormones that prevent pregnancy. They must be taken once a day, preferably at the same time each day. Birth control patch  The birth control patch contains hormones that prevent pregnancy. It is placed on the skin and must be changed once a week for three weeks and removed on the fourth week. A prescription is needed to use this method of contraception. Vaginal ring  A vaginal ring contains hormones that prevent pregnancy. It is placed in the vagina for three weeks and removed on the fourth week. After that, the process is repeated with a new ring. A prescription is needed to use this method of contraception. Emergency contraceptive Emergency contraceptives prevent pregnancy after unprotected sex. They come in pill form and can be taken up to 5 days after sex. They work best the sooner they are taken after having sex. Most emergency contraceptives are available without a prescription. This method should not be used as your only form of birth control. Barrier methods Female condom  A female condom is a thin sheath that is worn over the penis during sex. Condoms keep sperm from going inside a woman's body. They can be used with a spermicide to increase their effectiveness. They should be disposed after a single use. Female condom  A female condom is a soft, loose-fitting sheath that is put into the vagina before sex. The condom keeps sperm  from going inside a woman's body. They should be disposed after a single use. Diaphragm  A diaphragm is a soft, dome-shaped barrier. It is inserted into the vagina before sex, along with a spermicide. The diaphragm blocks sperm from entering the uterus, and the spermicide kills sperm. A diaphragm should be left in the vagina for 6-8 hours after sex and removed within 24 hours. A diaphragm is prescribed and fitted by a health care provider. A diaphragm should be replaced every 1-2 years, after giving birth, after gaining more than 15 lb (6.8 kg), and after pelvic surgery. Cervical cap  A cervical cap is a round, soft latex or plastic cup that fits over the cervix. It is inserted into the vagina before sex, along with spermicide. It blocks sperm from entering the uterus. The cap should be left in place for 6-8 hours after sex and removed within 48 hours. A cervical cap must be prescribed and fitted by a health care provider. It should be replaced every 2 years. Sponge  A sponge is a soft, circular piece of polyurethane foam with spermicide on it. The sponge helps block sperm from entering the uterus, and the spermicide kills sperm. To use it, you make it wet and then insert it into the vagina. It should be inserted before sex, left in for at least 6 hours after sex, and removed and thrown away within 30 hours. Spermicides Spermicides are chemicals that kill or block sperm from entering the cervix and uterus. They can come as a cream, jelly, suppository, foam, or tablet. A spermicide should be inserted into the   vagina with an applicator at least 10-15 minutes before sex to allow time for it to work. The process must be repeated every time you have sex. Spermicides do not require a prescription. Intrauterine contraception Intrauterine device (IUD) An IUD is a T-shaped device that is put in a woman's uterus. There are two types:  Hormone IUD.This type contains progestin, a synthetic form of the hormone  progesterone. This type can stay in place for 3-5 years.  Copper IUD.This type is wrapped in copper wire. It can stay in place for 10 years.  Permanent methods of contraception Female tubal ligation In this method, a woman's fallopian tubes are sealed, tied, or blocked during surgery to prevent eggs from traveling to the uterus. Hysteroscopic sterilization In this method, a small, flexible insert is placed into each fallopian tube. The inserts cause scar tissue to form in the fallopian tubes and block them, so sperm cannot reach an egg. The procedure takes about 3 months to be effective. Another form of birth control must be used during those 3 months. Female sterilization This is a procedure to tie off the tubes that carry sperm (vasectomy). After the procedure, the man can still ejaculate fluid (semen). Natural planning methods Natural family planning In this method, a couple does not have sex on days when the woman could become pregnant. Calendar method This means keeping track of the length of each menstrual cycle, identifying the days when pregnancy can happen, and not having sex on those days. Ovulation method In this method, a couple avoids sex during ovulation. Symptothermal method This method involves not having sex during ovulation. The woman typically checks for ovulation by watching changes in her temperature and in the consistency of cervical mucus. Post-ovulation method In this method, a couple waits to have sex until after ovulation. Summary  Contraception, also called birth control, means methods or devices that prevent pregnancy.  Hormonal methods of contraception include implants, injections, pills, patches, vaginal rings, and emergency contraceptives.  Barrier methods of contraception can include female condoms, female condoms, diaphragms, cervical caps, sponges, and spermicides.  There are two types of IUDs (intrauterine devices). An IUD can be put in a woman's uterus to  prevent pregnancy for 3-5 years.  Permanent sterilization can be done through a procedure for males, females, or both.  Natural family planning methods involve not having sex on days when the woman could become pregnant. This information is not intended to replace advice given to you by your health care provider. Make sure you discuss any questions you have with your health care provider. Document Revised: 01/16/2017 Document Reviewed: 02/17/2016 Elsevier Patient Education  2020 [employer]  

## 2020-06-28 ENCOUNTER — Other Ambulatory Visit: Payer: Self-pay

## 2020-06-28 ENCOUNTER — Ambulatory Visit (HOSPITAL_COMMUNITY)
Admission: EM | Admit: 2020-06-28 | Discharge: 2020-06-28 | Disposition: A | Discharge status: Home / Self Care | Payer: BLUE CROSS/BLUE SHIELD | Attending: Emergency Medicine | Admitting: Emergency Medicine

## 2020-06-28 ENCOUNTER — Encounter (HOSPITAL_COMMUNITY): Payer: Self-pay

## 2020-06-28 DIAGNOSIS — K5900 Constipation, unspecified: Secondary | ICD-10-CM

## 2020-06-28 DIAGNOSIS — K649 Unspecified hemorrhoids: Secondary | ICD-10-CM

## 2020-06-28 MED ORDER — DOCUSATE SODIUM 250 MG PO CAPS
250.0000 mg | ORAL_CAPSULE | Freq: Every day | ORAL | 0 refills | Status: AC
Start: 2020-06-28 — End: ?

## 2020-06-28 NOTE — ED Provider Notes (Signed)
[facility]    CSN: [id number] Arrival date & time: 06/28/20  [place]      History   Chief Complaint Chief Complaint  Patient presents with  . Constipation  . Hemorrhoids  . Rectal Bleeding  . Weakness    HPI Latasha Kramer is a 30 y.o. female.   Patient here for evaluation of constipation that has been ongoing for the past several months and bleeding that has been ongoing for the past 2 months.  Reports recently had a baby thought constipation would resolve after a few months, but it has not.  Reports noticing a "bump" on her rectum and having hard stools with bleeding for the past 2 months. Has not tried any OTC medications or treatment.  Denies any trauma, injury, or other precipitating event.  Denies any specific alleviating or aggravating factors.  Denies any fevers, chest pain, shortness of breath, N/V/D, numbness, tingling, abdominal pain, or headaches.    The history is provided by the patient.  Constipation Associated symptoms: hematochezia   Rectal Bleeding Weakness Associated symptoms: hematochezia     History reviewed. No pertinent past medical history.  There are no problems to display for this patient.   History reviewed. No pertinent surgical history.  OB History    Gravida  3   Para  3   Term  3   Preterm  0   AB  0   Living  3     SAB  0   IAB  0   Ectopic  0   Multiple  0   Live Births  2            Home Medications    Prior to Admission medications   Medication Sig Start Date End Date Taking? Authorizing Provider  docusate sodium (COLACE) 250 MG capsule Take 1 capsule (250 mg total) by mouth daily. 06/28/20  Yes Pearson Forster, NP  Blood Pressure Monitoring (BLOOD PRESSURE KIT) DEVI 1 kit by Does not apply route once a week. Check Blood Pressure regularly and record readings into the [employer] App.  Large Cuff.  DX O90.0 04/08/19   Leftwich-Kirby, Kathie Dike, CNM  cephALEXin (KEFLEX) 500 MG capsule Take 500 mg by mouth 4  (four) times daily. Patient not taking: Reported on 07/13/2019    [provider]  ibuprofen (ADVIL) 600 MG tablet Take 1 tablet (600 mg total) by mouth every 6 (six) hours. Patient not taking: Reported on 10/18/2019 09/09/19   Matilde Haymaker, MD  phenazopyridine (PYRIDIUM) 200 MG tablet Take 1 tablet (200 mg total) by mouth 3 (three) times daily as needed for pain. Patient not taking: Reported on 04/08/2019 04/18/17   Muthersbaugh, Jarrett Soho, PA-C  polyethylene glycol (MIRALAX) 17 g packet Take 17 g by mouth at bedtime. Patient not taking: Reported on 08/18/2019 07/13/19   Shelly Bombard, MD  potassium chloride SA (K-DUR,KLOR-CON) 20 MEQ tablet Take 1 tablet (20 mEq total) by mouth daily. Patient not taking: Reported on 04/08/2019 04/18/17   Muthersbaugh, Jarrett Soho, PA-C  Prenat-FeCbn-FeAsp-Meth-FA-DHA (PRENATE MINI) 18-0.6-0.4-350 MG CAPS Take 1 capsule by mouth daily. 04/08/19   Leftwich-Kirby, Kathie Dike, CNM  terconazole (TERAZOL 3) 0.8 % vaginal cream Place 1 applicator vaginally at bedtime. Patient not taking: Reported on 10/18/2019 08/24/19   Shelly Bombard, MD    Family History Family History  Problem Relation Age of Onset  . Healthy Mother   . Healthy Father   . Healthy Sister   . Healthy Brother  Social History Social History   Tobacco Use  . Smoking status: Never Smoker  . Smokeless tobacco: Never Used  Vaping Use  . Vaping Use: Never used  Substance Use Topics  . Alcohol use: No  . Drug use: No     Allergies   Patient has no known allergies.   Review of Systems Review of Systems  Gastrointestinal: Positive for blood in stool, constipation and hematochezia.  Neurological: Positive for weakness.  All other systems reviewed and are negative.    Physical Exam Triage Vital Signs ED Triage Vitals  Enc Vitals Group     BP 06/28/20 1849 103/78     Pulse Rate 06/28/20 1849 70     Resp 06/28/20 1849 18     Temp 06/28/20 1849 98.5 F (36.9 C)     Temp src --       SpO2 06/28/20 1849 97 %     Weight --      Height --      Head Circumference --      Peak Flow --      Pain Score 06/28/20 1847 6     Pain Loc --      Pain Edu? --      Excl. in [place]? --    No data found.  Updated Vital Signs BP 103/78   Pulse 70   Temp 98.5 F (36.9 C)   Resp 18   LMP 06/11/2020 (Within Days)   SpO2 97%   Visual Acuity Right Eye Distance:   Left Eye Distance:   Bilateral Distance:    Right Eye Near:   Left Eye Near:    Bilateral Near:     Physical Exam Vitals and nursing note reviewed.  Constitutional:      General: She is not in acute distress.    Appearance: Normal appearance. She is not ill-appearing, toxic-appearing or diaphoretic.  HENT:     Head: Normocephalic and atraumatic.  Eyes:     Conjunctiva/sclera: Conjunctivae normal.  Cardiovascular:     Rate and Rhythm: Normal rate.     Pulses: Normal pulses.  Pulmonary:     Effort: Pulmonary effort is normal.  Abdominal:     General: Abdomen is flat. Bowel sounds are normal. There is no distension.     Palpations: Abdomen is soft. There is no shifting dullness, fluid wave, hepatomegaly, splenomegaly, mass or pulsatile mass.     Tenderness: There is no abdominal tenderness. There is no right CVA tenderness, left CVA tenderness, guarding or rebound. Negative signs include Murphy's sign, Rovsing's sign, McBurney's sign and obturator sign.  Genitourinary:    Rectum: External hemorrhoid present.  Musculoskeletal:        General: Normal range of motion.     Cervical back: Normal range of motion.  Skin:    General: Skin is warm and dry.  Neurological:     General: No focal deficit present.     Mental Status: She is alert and oriented to person, place, and time.  Psychiatric:        Mood and Affect: Mood normal.      UC Treatments / Results  Labs (all labs ordered are listed, but only abnormal results are displayed) Labs Reviewed - No data to display  EKG   Radiology No results  found.  Procedures Procedures (including critical care time)  Medications Ordered in UC Medications - No data to display  Initial Impression / Assessment and Plan / UC Course  I have  reviewed the triage vital signs and the nursing notes.  Pertinent labs & imaging results that were available during my care of the patient were reviewed by me and considered in my medical decision making (see chart for details).    Assessment negative for red flags or concerns.  Vital signs stable.   Constipation.  Miralax and colace daily until you have a complete bowel movement.  Encourage fluids, especially water.  Follow up with Primary care for re-evaluation.   Hemorrhoids.  No bleeding or thrombosis.  May use Preparation H wipes or creams to help with pain.  Follow up with General Surgery for possible removal.  If bleeding worsens, go to the ED for further evaluation.   ED for any worsening symptoms.   Final Clinical Impressions(s) / UC Diagnoses   Final diagnoses:  Constipation, unspecified constipation type  Hemorrhoids, unspecified hemorrhoid type     Discharge Instructions     Take Miralax daily until you have a good bowel movement.  Take the Colace daily to help soften your stools.    You can use Preparation H wipes or creams to help with pain related to hemorrhoids.   Make sure you are drinking plenty of fluids, especially water.    Follow up with General Surgery for possible hemorrhoid removal.  Follow up with your Primary Care provider for re-evaluation as soon as possible.   If your bleeding gets worse, go to the Emergency Department for further evaluation.   If you develop any weakness, dizziness, chest pain, shortness of breath, headaches, blurred vision, go to the Emergency Department for further evaluation.      ED Prescriptions    Medication Sig Dispense Auth. Provider   docusate sodium (COLACE) 250 MG capsule Take 1 capsule (250 mg total) by mouth daily. 10 capsule  Pearson Forster, NP     PDMP not reviewed this encounter.   Pearson Forster, NP 06/28/20 [phone]

## 2020-06-28 NOTE — Discharge Instructions (Addendum)
Take Miralax daily until you have a good bowel movement.  Take the Colace daily to help soften your stools.    You can use Preparation H wipes or creams to help with pain related to hemorrhoids.   Make sure you are drinking plenty of fluids, especially water.    Follow up with General Surgery for possible hemorrhoid removal.  Follow up with your Primary Care provider for re-evaluation as soon as possible.   If your bleeding gets worse, go to the Emergency Department for further evaluation.   If you develop any weakness, dizziness, chest pain, shortness of breath, headaches, blurred vision, go to the Emergency Department for further evaluation.

## 2020-06-28 NOTE — ED Triage Notes (Signed)
Pt c/o constipation/hard bowel movements and feels may have hemorrhoid. Reports painful lump to rectal opening area. Pt reports she gave birth 9 months ago and hoped constipation would stop after pregnancy, but has continued since then.  Pt reports bleeding from rectum with every bowel movement for 2 months. Reports blood is about the same amount as when she has her period. Pt reports feeling weak/shaky after bleeding episodes.
